# Patient Record
Sex: Male | Born: 1950 | Race: Black or African American | Hispanic: No | Smoking: Former smoker
Health system: Southern US, Community
[De-identification: ages and names within clinical notes are randomized; demographics above are authoritative.]

## PROBLEM LIST (undated history)

## (undated) DIAGNOSIS — E785 Hyperlipidemia, unspecified: Secondary | ICD-10-CM

## (undated) DIAGNOSIS — G4730 Sleep apnea, unspecified: Secondary | ICD-10-CM

## (undated) DIAGNOSIS — I10 Essential (primary) hypertension: Secondary | ICD-10-CM

## (undated) HISTORY — DX: Sleep apnea, unspecified: G47.30

## (undated) HISTORY — DX: Essential (primary) hypertension: I10

## (undated) HISTORY — DX: Morbid (severe) obesity due to excess calories: E66.01

## (undated) HISTORY — DX: Hyperlipidemia, unspecified: E78.5

---

## 2005-04-02 ENCOUNTER — Encounter: Admission: RE | Admit: 2005-04-02 | Discharge: 2005-04-02 | Payer: Self-pay | Admitting: Family Medicine

## 2005-12-27 IMAGING — CR DG CHEST 2V
2 series · 2 of 2 positions shown · non-contrast
Comparison: none

CLINICAL DATA: Bronchitis.  Former smoker.  Cough.  Congestion. 
 CHEST ? TWO VIEWS:
 Two views of the chest show the lungs to be clear.  A poorly defined nodular opacity is seen in the periphery of the right mid lung measuring 7 mm. This probably represents a faintly calcified granuloma, but comparison with prior chest x-ray or follow-up chest x-ray is recommended.  The heart is mildly enlarged.  No acute bony abnormality is seen.

[w chest pa]
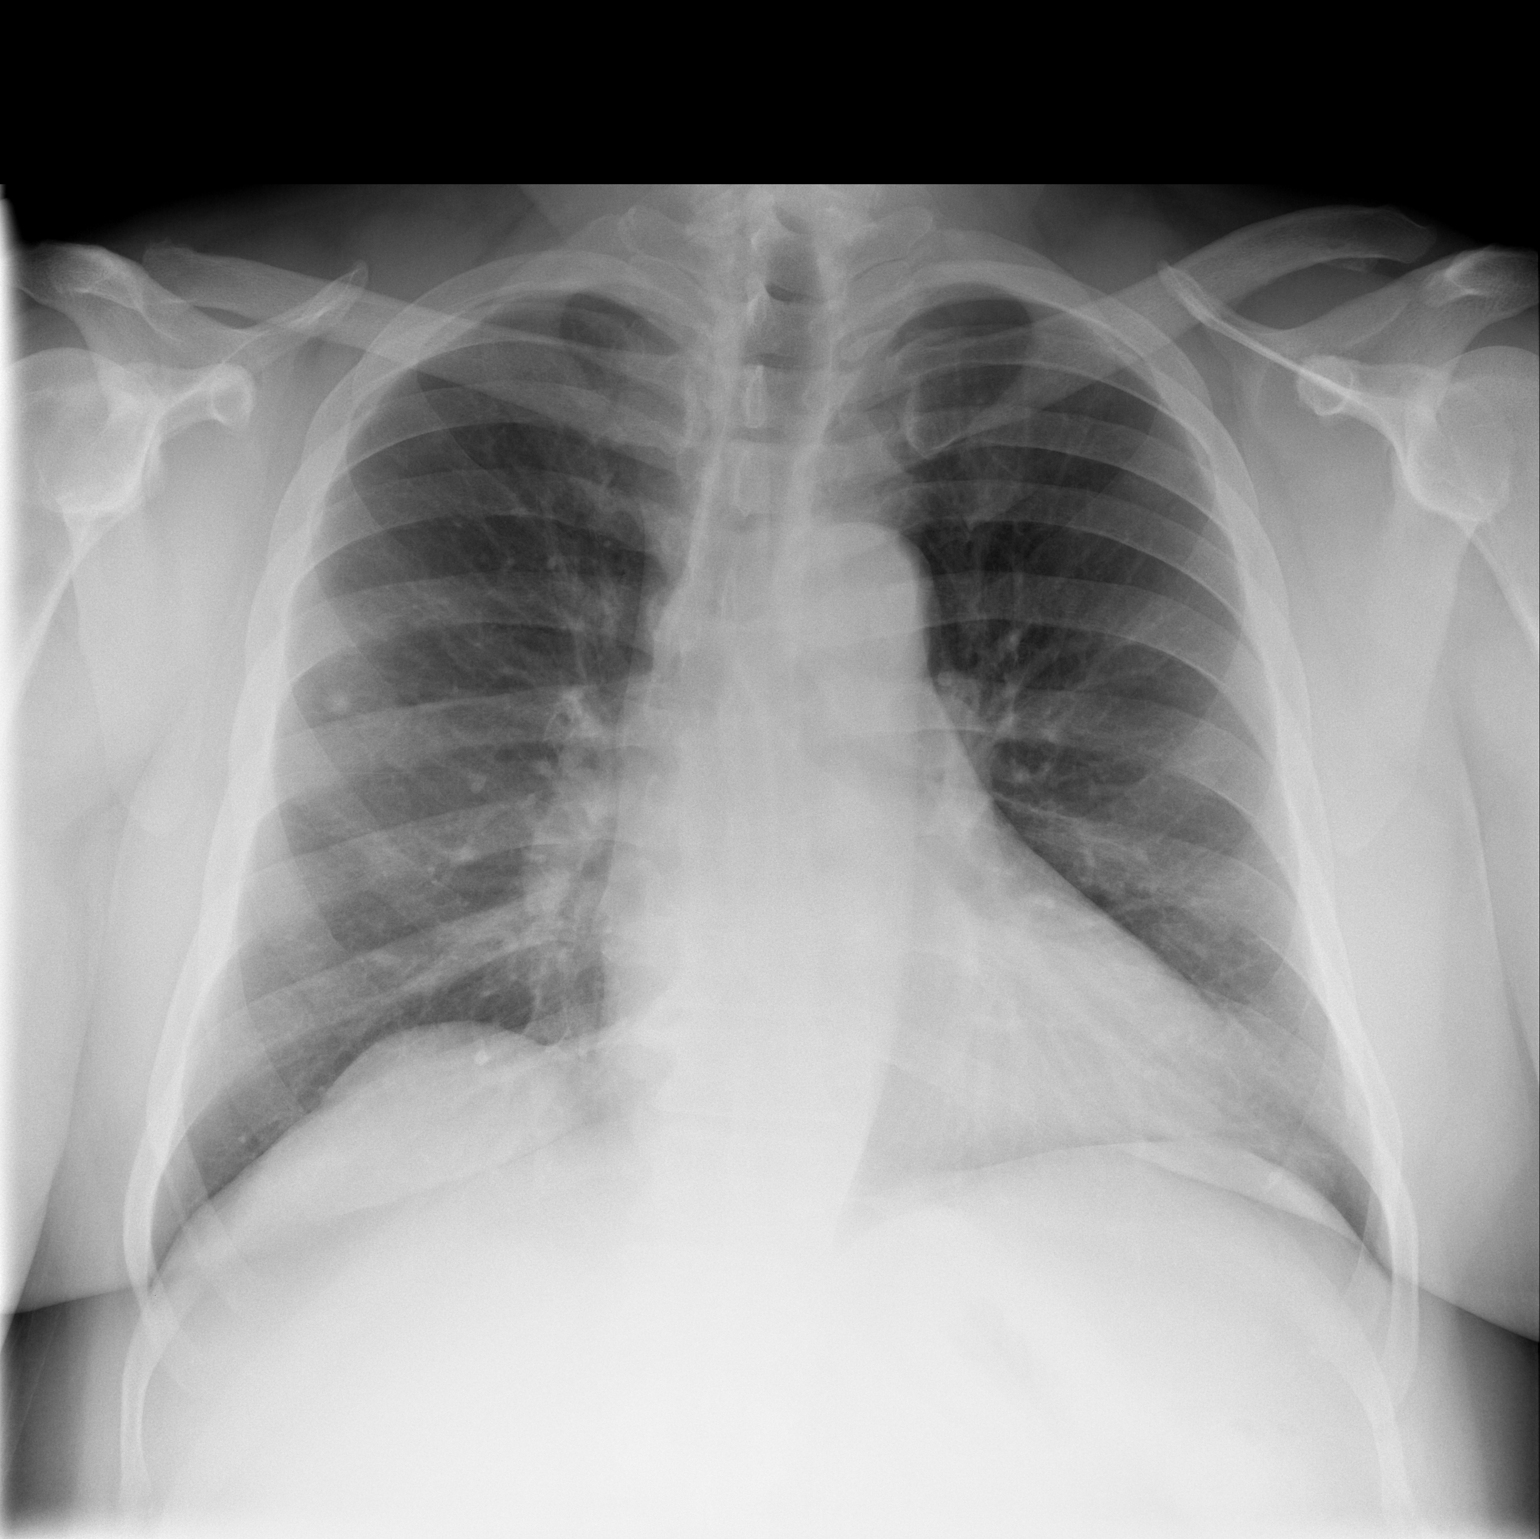

[w chest lat]
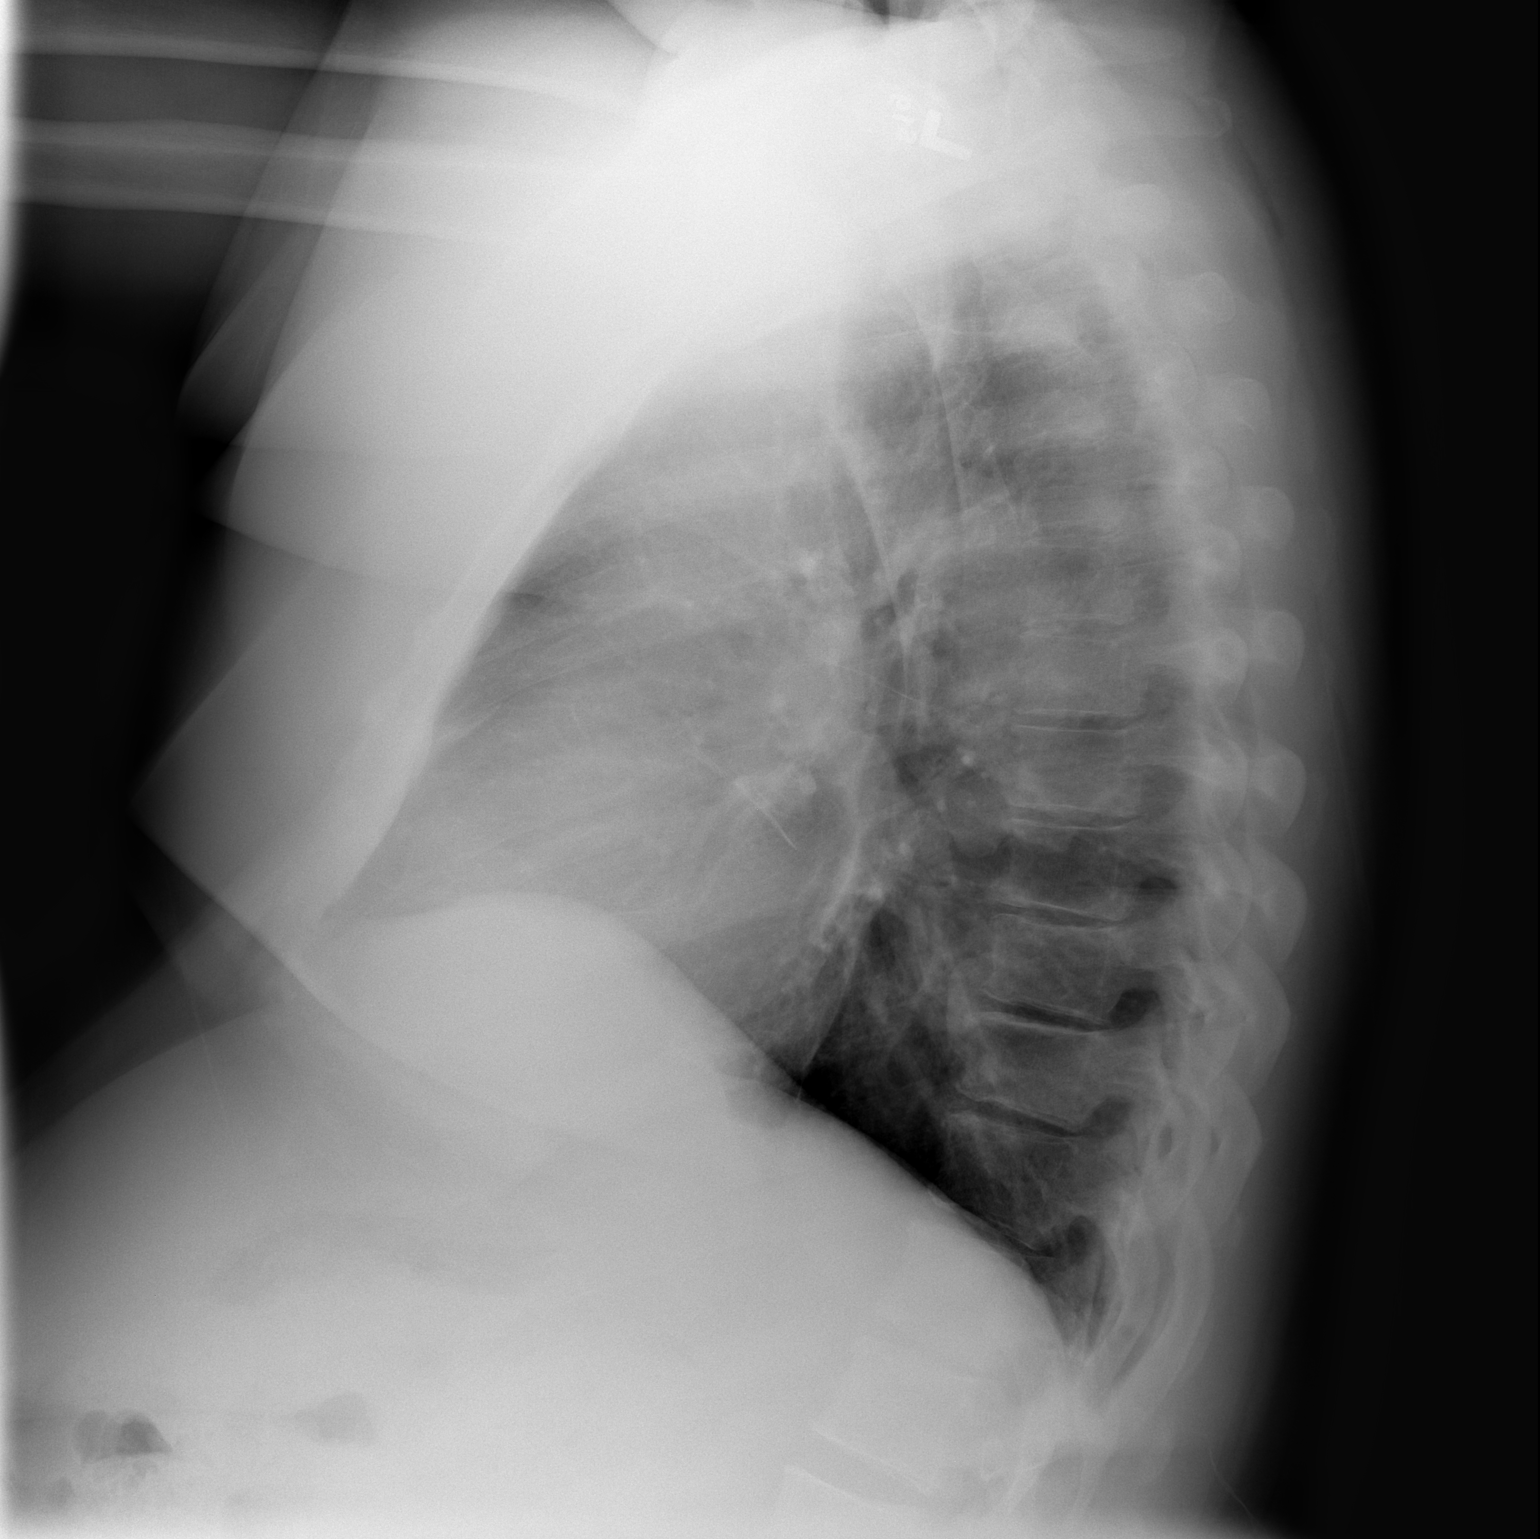

[2 of 2 positions shown; findings below may reference images not displayed]

IMPRESSION: No active lung disease.   Question partially calcified granuloma in the periphery of the right mid lung.  Compare with prior or follow-up chest x-ray.

## 2014-02-07 ENCOUNTER — Encounter (INDEPENDENT_AMBULATORY_CARE_PROVIDER_SITE_OTHER): Payer: Self-pay

## 2014-02-07 ENCOUNTER — Ambulatory Visit (INDEPENDENT_AMBULATORY_CARE_PROVIDER_SITE_OTHER): Payer: No Typology Code available for payment source | Admitting: Pulmonary Disease

## 2014-02-07 ENCOUNTER — Encounter: Payer: Self-pay | Admitting: Pulmonary Disease

## 2014-02-07 VITALS — BP 128/82 | HR 57 | Temp 97.5°F | Ht 70.0 in | Wt 333.4 lb

## 2014-02-07 DIAGNOSIS — G4733 Obstructive sleep apnea (adult) (pediatric): Secondary | ICD-10-CM | POA: Insufficient documentation

## 2014-02-07 NOTE — Assessment & Plan Note (Addendum)
He reports snoring, sleep disruption, apnea, and daytime sleepiness.  I am concerned he has sleep apnea.  We discussed how sleep apnea can affect various health problems including risks for hypertension, cardiovascular disease, and diabetes.  We also discussed how sleep disruption can increase risks for accident, such as while driving.  Weight loss as a means of improving sleep apnea was also reviewed.  Additional treatment options discussed were CPAP therapy, oral appliance, and surgical intervention.  To further assess will arrange for in lab sleep study.

## 2014-02-07 NOTE — Patient Instructions (Signed)
Will arrange for sleep study Will call to arrange for follow up after sleep study reviewed 

## 2014-02-07 NOTE — Progress Notes (Deleted)
   Subjective:    Patient ID: Victor Garrison, male    DOB: 03/05/1951, 63 y.o.   MRN: [id number]  HPI    Review of Systems  Constitutional: Negative for fever and unexpected weight change.  HENT: Positive for postnasal drip. Negative for congestion, dental problem, ear pain, nosebleeds, rhinorrhea, sinus pressure, sneezing, sore throat and trouble swallowing.   Eyes: Negative for redness and itching.  Respiratory: Positive for shortness of breath. Negative for cough, chest tightness and wheezing.   Cardiovascular: Positive for palpitations and leg swelling.  Gastrointestinal: Negative for nausea and vomiting.  Genitourinary: Negative for dysuria.  Musculoskeletal: Positive for joint swelling.  Skin: Negative for rash.  Neurological: Negative for headaches.  Hematological: Does not bruise/bleed easily.  Psychiatric/Behavioral: Negative for dysphoric mood. The patient is not nervous/anxious.        Objective:   Physical Exam        Assessment & Plan:

## 2014-02-07 NOTE — Progress Notes (Signed)
Chief Complaint  Patient presents with  . Sleep Consult    Referred by Dr. Cloyd Stagers for sleep apnea which may be causing fluids retention, per pt. EPWORTH=12    History of Present Illness: Victor Garrison is a 63 y.o. male for evaluation of sleep problems.  He has difficulty with leg swelling.  His PCP was concerned this could be related to sleep apnea.  He tried doing a home sleep study about 1 year ago, but this was an incomplete study.  He does snore and his mouth gets dry while asleep.  He gets sleep during the day, and can sometimes fall asleep when he is sitting quiet.  He goes to sleep at 11 pm.  He falls asleep quickly.  He wakes up 2 to 3 times to use the bathroom.  He gets out of bed at 545 am.  He feels tired in the morning.  He denies morning headache.  He does not use anything to help him fall sleep or stay awake.  He denies sleep walking, sleep talking, bruxism, or nightmares.  There is no history of restless legs.  He denies sleep hallucinations, sleep paralysis, or cataplexy.  The Epworth score is 12 out of 24.  Victor Garrison  has a past medical history of Hyperlipidemia and Sleep apnea.  Victor Garrison  has no past surgical history on file.  Prior to Admission medications   Medication Sig Start Date End Date Taking? Authorizing Provider  Cholecalciferol (VITAMIN D-3) 5000 UNITS TABS Take 1 capsule by mouth daily.   Yes Historical Provider, MD  furosemide (LASIX) 20 MG tablet Take 20 mg by mouth daily.   Yes Historical Provider, MD  Multiple Vitamins-Minerals (MULTIVITAMIN WITH MINERALS) tablet Take 1 tablet by mouth daily.   Yes Historical Provider, MD  testosterone cypionate (DEPOTESTOTERONE CYPIONATE) 200 MG/ML injection Inject 200 mg into the muscle every 28 (twenty-eight) days.   Yes Historical Provider, MD    Allergies  Allergen Reactions  . Penicillins     His family history includes COPD in his brother; Hypertension in his mother; Lung cancer in his brother,  father, and mother.  He  reports that he quit smoking about 18 years ago. His smoking use included Cigarettes. He has a 2.52 pack-year smoking history. He has quit using smokeless tobacco. He reports that he drinks alcohol. He reports that he does not use illicit drugs.  Review of Systems  Constitutional: Negative for fever and unexpected weight change.  HENT: Positive for postnasal drip. Negative for congestion, dental problem, ear pain, nosebleeds, rhinorrhea, sinus pressure, sneezing, sore throat and trouble swallowing.   Eyes: Negative for redness and itching.  Respiratory: Positive for shortness of breath. Negative for cough, chest tightness and wheezing.   Cardiovascular: Positive for palpitations and leg swelling.  Gastrointestinal: Negative for nausea and vomiting.  Genitourinary: Negative for dysuria.  Musculoskeletal: Positive for joint swelling.  Skin: Negative for rash.  Neurological: Negative for headaches.  Hematological: Does not bruise/bleed easily.  Psychiatric/Behavioral: Negative for dysphoric mood. The patient is not nervous/anxious.    Physical Exam:  General - No distress ENT - No sinus tenderness, no oral exudate, no LAN, no thyromegaly, TM clear, pupils equal/reactive, poor dentition, MP 3 Cardiac - s1s2 regular, no murmur, pulses symmetric Chest - No wheeze/rales/dullness, good air entry, normal respiratory excursion Back - No focal tenderness Abd - Soft, non-tender, no organomegaly, + bowel sounds Ext - 1+ lower extremity edema Neuro - Normal strength, cranial nerves intact  Skin - chronic venous stasis changes Psych - Normal mood, and behavior  Assessment/plan:  Victor HellingVineet Pilar Garrison, M.D. Pager [phone]

## 2014-03-15 ENCOUNTER — Encounter (HOSPITAL_BASED_OUTPATIENT_CLINIC_OR_DEPARTMENT_OTHER): Payer: No Typology Code available for payment source

## 2014-04-26 ENCOUNTER — Encounter (HOSPITAL_BASED_OUTPATIENT_CLINIC_OR_DEPARTMENT_OTHER): Payer: No Typology Code available for payment source

## 2014-06-07 ENCOUNTER — Ambulatory Visit (HOSPITAL_BASED_OUTPATIENT_CLINIC_OR_DEPARTMENT_OTHER): Payer: Self-pay | Attending: Pulmonary Disease

## 2016-11-27 DIAGNOSIS — E7800 Pure hypercholesterolemia, unspecified: Secondary | ICD-10-CM | POA: Diagnosis not present

## 2016-11-27 DIAGNOSIS — R079 Chest pain, unspecified: Secondary | ICD-10-CM | POA: Diagnosis not present

## 2016-11-27 DIAGNOSIS — R05 Cough: Secondary | ICD-10-CM | POA: Diagnosis not present

## 2016-11-27 DIAGNOSIS — J209 Acute bronchitis, unspecified: Secondary | ICD-10-CM | POA: Diagnosis not present

## 2016-11-27 DIAGNOSIS — M791 Myalgia: Secondary | ICD-10-CM | POA: Diagnosis not present

## 2016-11-27 DIAGNOSIS — Z87891 Personal history of nicotine dependence: Secondary | ICD-10-CM | POA: Diagnosis not present

## 2016-11-27 DIAGNOSIS — J440 Chronic obstructive pulmonary disease with acute lower respiratory infection: Secondary | ICD-10-CM | POA: Diagnosis not present

## 2016-11-27 DIAGNOSIS — R0789 Other chest pain: Secondary | ICD-10-CM | POA: Diagnosis not present

## 2016-12-09 DIAGNOSIS — R05 Cough: Secondary | ICD-10-CM | POA: Diagnosis not present

## 2016-12-09 DIAGNOSIS — R7303 Prediabetes: Secondary | ICD-10-CM | POA: Diagnosis not present

## 2016-12-09 DIAGNOSIS — E559 Vitamin D deficiency, unspecified: Secondary | ICD-10-CM | POA: Diagnosis not present

## 2016-12-09 DIAGNOSIS — J302 Other seasonal allergic rhinitis: Secondary | ICD-10-CM | POA: Diagnosis not present

## 2016-12-09 DIAGNOSIS — I119 Hypertensive heart disease without heart failure: Secondary | ICD-10-CM | POA: Diagnosis not present

## 2016-12-09 DIAGNOSIS — E785 Hyperlipidemia, unspecified: Secondary | ICD-10-CM | POA: Diagnosis not present

## 2016-12-09 DIAGNOSIS — I10 Essential (primary) hypertension: Secondary | ICD-10-CM | POA: Diagnosis not present

## 2016-12-09 DIAGNOSIS — E7211 Homocystinuria: Secondary | ICD-10-CM | POA: Diagnosis not present

## 2017-03-04 DIAGNOSIS — Z125 Encounter for screening for malignant neoplasm of prostate: Secondary | ICD-10-CM | POA: Diagnosis not present

## 2017-03-04 DIAGNOSIS — E559 Vitamin D deficiency, unspecified: Secondary | ICD-10-CM | POA: Diagnosis not present

## 2017-03-04 DIAGNOSIS — E785 Hyperlipidemia, unspecified: Secondary | ICD-10-CM | POA: Diagnosis not present

## 2017-03-04 DIAGNOSIS — J40 Bronchitis, not specified as acute or chronic: Secondary | ICD-10-CM | POA: Diagnosis not present

## 2017-03-04 DIAGNOSIS — E7211 Homocystinuria: Secondary | ICD-10-CM | POA: Diagnosis not present

## 2017-03-04 DIAGNOSIS — I10 Essential (primary) hypertension: Secondary | ICD-10-CM | POA: Diagnosis not present

## 2017-03-04 DIAGNOSIS — J302 Other seasonal allergic rhinitis: Secondary | ICD-10-CM | POA: Diagnosis not present

## 2017-03-04 DIAGNOSIS — I119 Hypertensive heart disease without heart failure: Secondary | ICD-10-CM | POA: Diagnosis not present

## 2017-03-04 DIAGNOSIS — Z0000 Encounter for general adult medical examination without abnormal findings: Secondary | ICD-10-CM | POA: Diagnosis not present

## 2017-03-04 DIAGNOSIS — R7303 Prediabetes: Secondary | ICD-10-CM | POA: Diagnosis not present

## 2018-03-11 DIAGNOSIS — Z125 Encounter for screening for malignant neoplasm of prostate: Secondary | ICD-10-CM | POA: Diagnosis not present

## 2018-03-11 DIAGNOSIS — E7211 Homocystinuria: Secondary | ICD-10-CM | POA: Diagnosis not present

## 2018-03-11 DIAGNOSIS — R7303 Prediabetes: Secondary | ICD-10-CM | POA: Diagnosis not present

## 2018-03-11 DIAGNOSIS — E559 Vitamin D deficiency, unspecified: Secondary | ICD-10-CM | POA: Diagnosis not present

## 2018-03-11 DIAGNOSIS — Z0000 Encounter for general adult medical examination without abnormal findings: Secondary | ICD-10-CM | POA: Diagnosis not present

## 2018-03-11 DIAGNOSIS — I10 Essential (primary) hypertension: Secondary | ICD-10-CM | POA: Diagnosis not present

## 2018-03-11 DIAGNOSIS — J329 Chronic sinusitis, unspecified: Secondary | ICD-10-CM | POA: Diagnosis not present

## 2018-03-11 DIAGNOSIS — E785 Hyperlipidemia, unspecified: Secondary | ICD-10-CM | POA: Diagnosis not present

## 2018-03-11 DIAGNOSIS — J302 Other seasonal allergic rhinitis: Secondary | ICD-10-CM | POA: Diagnosis not present

## 2018-03-11 DIAGNOSIS — I119 Hypertensive heart disease without heart failure: Secondary | ICD-10-CM | POA: Diagnosis not present

## 2019-03-15 DIAGNOSIS — Z1389 Encounter for screening for other disorder: Secondary | ICD-10-CM | POA: Diagnosis not present

## 2019-03-15 DIAGNOSIS — I119 Hypertensive heart disease without heart failure: Secondary | ICD-10-CM | POA: Diagnosis not present

## 2019-03-15 DIAGNOSIS — E559 Vitamin D deficiency, unspecified: Secondary | ICD-10-CM | POA: Diagnosis not present

## 2019-03-15 DIAGNOSIS — E7211 Homocystinuria: Secondary | ICD-10-CM | POA: Diagnosis not present

## 2019-03-15 DIAGNOSIS — I10 Essential (primary) hypertension: Secondary | ICD-10-CM | POA: Diagnosis not present

## 2019-03-15 DIAGNOSIS — Z125 Encounter for screening for malignant neoplasm of prostate: Secondary | ICD-10-CM | POA: Diagnosis not present

## 2019-03-15 DIAGNOSIS — Z0001 Encounter for general adult medical examination with abnormal findings: Secondary | ICD-10-CM | POA: Diagnosis not present

## 2019-03-15 DIAGNOSIS — R7303 Prediabetes: Secondary | ICD-10-CM | POA: Diagnosis not present

## 2019-03-15 DIAGNOSIS — E782 Mixed hyperlipidemia: Secondary | ICD-10-CM | POA: Diagnosis not present

## 2019-03-15 DIAGNOSIS — J302 Other seasonal allergic rhinitis: Secondary | ICD-10-CM | POA: Diagnosis not present

## 2019-03-21 DIAGNOSIS — R0683 Snoring: Secondary | ICD-10-CM | POA: Diagnosis not present

## 2019-03-21 DIAGNOSIS — G4719 Other hypersomnia: Secondary | ICD-10-CM | POA: Diagnosis not present

## 2019-03-21 DIAGNOSIS — Z6841 Body Mass Index (BMI) 40.0 and over, adult: Secondary | ICD-10-CM | POA: Diagnosis not present

## 2019-03-27 DIAGNOSIS — I10 Essential (primary) hypertension: Secondary | ICD-10-CM | POA: Diagnosis not present

## 2019-03-27 DIAGNOSIS — E782 Mixed hyperlipidemia: Secondary | ICD-10-CM | POA: Diagnosis not present

## 2019-03-27 DIAGNOSIS — E7211 Homocystinuria: Secondary | ICD-10-CM | POA: Diagnosis not present

## 2019-03-27 DIAGNOSIS — Z86010 Personal history of colonic polyps: Secondary | ICD-10-CM | POA: Diagnosis not present

## 2019-03-27 DIAGNOSIS — J302 Other seasonal allergic rhinitis: Secondary | ICD-10-CM | POA: Diagnosis not present

## 2019-03-27 DIAGNOSIS — E559 Vitamin D deficiency, unspecified: Secondary | ICD-10-CM | POA: Diagnosis not present

## 2019-03-27 DIAGNOSIS — D509 Iron deficiency anemia, unspecified: Secondary | ICD-10-CM | POA: Diagnosis not present

## 2019-03-27 DIAGNOSIS — I119 Hypertensive heart disease without heart failure: Secondary | ICD-10-CM | POA: Diagnosis not present

## 2019-03-27 DIAGNOSIS — R7303 Prediabetes: Secondary | ICD-10-CM | POA: Diagnosis not present

## 2019-04-02 DIAGNOSIS — R0683 Snoring: Secondary | ICD-10-CM | POA: Diagnosis not present

## 2019-04-02 DIAGNOSIS — G4719 Other hypersomnia: Secondary | ICD-10-CM | POA: Diagnosis not present

## 2019-04-07 DIAGNOSIS — G4733 Obstructive sleep apnea (adult) (pediatric): Secondary | ICD-10-CM | POA: Diagnosis not present

## 2019-04-07 DIAGNOSIS — R0683 Snoring: Secondary | ICD-10-CM | POA: Diagnosis not present

## 2019-04-07 DIAGNOSIS — G4719 Other hypersomnia: Secondary | ICD-10-CM | POA: Diagnosis not present

## 2019-04-07 DIAGNOSIS — Z6841 Body Mass Index (BMI) 40.0 and over, adult: Secondary | ICD-10-CM | POA: Diagnosis not present

## 2019-05-26 DIAGNOSIS — Z03818 Encounter for observation for suspected exposure to other biological agents ruled out: Secondary | ICD-10-CM | POA: Diagnosis not present

## 2019-06-21 ENCOUNTER — Other Ambulatory Visit: Payer: Self-pay

## 2019-06-21 DIAGNOSIS — Z20822 Contact with and (suspected) exposure to covid-19: Secondary | ICD-10-CM

## 2019-06-22 LAB — NOVEL CORONAVIRUS, NAA: SARS-CoV-2, NAA: NOT DETECTED

## 2019-07-03 DIAGNOSIS — E7211 Homocystinuria: Secondary | ICD-10-CM | POA: Diagnosis not present

## 2019-07-03 DIAGNOSIS — I10 Essential (primary) hypertension: Secondary | ICD-10-CM | POA: Diagnosis not present

## 2019-07-03 DIAGNOSIS — Z86010 Personal history of colonic polyps: Secondary | ICD-10-CM | POA: Diagnosis not present

## 2019-07-03 DIAGNOSIS — E782 Mixed hyperlipidemia: Secondary | ICD-10-CM | POA: Diagnosis not present

## 2019-07-03 DIAGNOSIS — R7303 Prediabetes: Secondary | ICD-10-CM | POA: Diagnosis not present

## 2019-07-03 DIAGNOSIS — E559 Vitamin D deficiency, unspecified: Secondary | ICD-10-CM | POA: Diagnosis not present

## 2019-07-03 DIAGNOSIS — I119 Hypertensive heart disease without heart failure: Secondary | ICD-10-CM | POA: Diagnosis not present

## 2019-07-03 DIAGNOSIS — D509 Iron deficiency anemia, unspecified: Secondary | ICD-10-CM | POA: Diagnosis not present

## 2019-07-03 DIAGNOSIS — J302 Other seasonal allergic rhinitis: Secondary | ICD-10-CM | POA: Diagnosis not present

## 2019-07-03 DIAGNOSIS — G4733 Obstructive sleep apnea (adult) (pediatric): Secondary | ICD-10-CM | POA: Diagnosis not present

## 2019-07-27 ENCOUNTER — Other Ambulatory Visit: Payer: Self-pay

## 2019-07-27 DIAGNOSIS — Z20822 Contact with and (suspected) exposure to covid-19: Secondary | ICD-10-CM

## 2019-07-27 DIAGNOSIS — R6889 Other general symptoms and signs: Secondary | ICD-10-CM | POA: Diagnosis not present

## 2019-07-28 LAB — NOVEL CORONAVIRUS, NAA: SARS-CoV-2, NAA: NOT DETECTED

## 2019-08-14 ENCOUNTER — Other Ambulatory Visit: Payer: Self-pay

## 2019-08-14 DIAGNOSIS — Z20822 Contact with and (suspected) exposure to covid-19: Secondary | ICD-10-CM

## 2019-08-15 LAB — NOVEL CORONAVIRUS, NAA: SARS-CoV-2, NAA: NOT DETECTED

## 2019-09-06 ENCOUNTER — Other Ambulatory Visit: Payer: Self-pay

## 2019-09-06 DIAGNOSIS — Z20828 Contact with and (suspected) exposure to other viral communicable diseases: Secondary | ICD-10-CM | POA: Diagnosis not present

## 2019-09-06 DIAGNOSIS — Z20822 Contact with and (suspected) exposure to covid-19: Secondary | ICD-10-CM

## 2019-09-08 LAB — NOVEL CORONAVIRUS, NAA: SARS-CoV-2, NAA: NOT DETECTED

## 2019-10-06 ENCOUNTER — Other Ambulatory Visit: Payer: Self-pay

## 2019-10-06 DIAGNOSIS — Z20822 Contact with and (suspected) exposure to covid-19: Secondary | ICD-10-CM

## 2019-10-06 DIAGNOSIS — Z20828 Contact with and (suspected) exposure to other viral communicable diseases: Secondary | ICD-10-CM | POA: Diagnosis not present

## 2019-10-09 LAB — NOVEL CORONAVIRUS, NAA: SARS-CoV-2, NAA: NOT DETECTED

## 2020-09-10 DIAGNOSIS — Z23 Encounter for immunization: Secondary | ICD-10-CM | POA: Diagnosis not present

## 2021-08-27 DIAGNOSIS — R7303 Prediabetes: Secondary | ICD-10-CM | POA: Diagnosis not present

## 2021-08-27 DIAGNOSIS — G4733 Obstructive sleep apnea (adult) (pediatric): Secondary | ICD-10-CM | POA: Diagnosis not present

## 2021-08-27 DIAGNOSIS — Z86010 Personal history of colonic polyps: Secondary | ICD-10-CM | POA: Diagnosis not present

## 2021-08-27 DIAGNOSIS — E782 Mixed hyperlipidemia: Secondary | ICD-10-CM | POA: Diagnosis not present

## 2021-08-27 DIAGNOSIS — I10 Essential (primary) hypertension: Secondary | ICD-10-CM | POA: Diagnosis not present

## 2021-08-27 DIAGNOSIS — Z125 Encounter for screening for malignant neoplasm of prostate: Secondary | ICD-10-CM | POA: Diagnosis not present

## 2021-08-27 DIAGNOSIS — Z0001 Encounter for general adult medical examination with abnormal findings: Secondary | ICD-10-CM | POA: Diagnosis not present

## 2021-08-27 DIAGNOSIS — D509 Iron deficiency anemia, unspecified: Secondary | ICD-10-CM | POA: Diagnosis not present

## 2021-08-27 DIAGNOSIS — I119 Hypertensive heart disease without heart failure: Secondary | ICD-10-CM | POA: Diagnosis not present

## 2021-08-27 DIAGNOSIS — E559 Vitamin D deficiency, unspecified: Secondary | ICD-10-CM | POA: Diagnosis not present

## 2021-10-02 DIAGNOSIS — Z1152 Encounter for screening for COVID-19: Secondary | ICD-10-CM | POA: Diagnosis not present

## 2021-10-21 DIAGNOSIS — I10 Essential (primary) hypertension: Secondary | ICD-10-CM | POA: Diagnosis not present

## 2021-10-21 DIAGNOSIS — E782 Mixed hyperlipidemia: Secondary | ICD-10-CM | POA: Diagnosis not present

## 2021-10-21 DIAGNOSIS — G4733 Obstructive sleep apnea (adult) (pediatric): Secondary | ICD-10-CM | POA: Diagnosis not present

## 2021-10-21 DIAGNOSIS — Z86010 Personal history of colonic polyps: Secondary | ICD-10-CM | POA: Diagnosis not present

## 2021-10-21 DIAGNOSIS — D509 Iron deficiency anemia, unspecified: Secondary | ICD-10-CM | POA: Diagnosis not present

## 2021-10-21 DIAGNOSIS — E559 Vitamin D deficiency, unspecified: Secondary | ICD-10-CM | POA: Diagnosis not present

## 2021-10-21 DIAGNOSIS — R7303 Prediabetes: Secondary | ICD-10-CM | POA: Diagnosis not present

## 2021-10-21 DIAGNOSIS — I119 Hypertensive heart disease without heart failure: Secondary | ICD-10-CM | POA: Diagnosis not present

## 2021-12-24 DIAGNOSIS — Z86010 Personal history of colonic polyps: Secondary | ICD-10-CM | POA: Diagnosis not present

## 2021-12-24 DIAGNOSIS — D509 Iron deficiency anemia, unspecified: Secondary | ICD-10-CM | POA: Diagnosis not present

## 2021-12-24 DIAGNOSIS — E782 Mixed hyperlipidemia: Secondary | ICD-10-CM | POA: Diagnosis not present

## 2021-12-24 DIAGNOSIS — I119 Hypertensive heart disease without heart failure: Secondary | ICD-10-CM | POA: Diagnosis not present

## 2021-12-24 DIAGNOSIS — I10 Essential (primary) hypertension: Secondary | ICD-10-CM | POA: Diagnosis not present

## 2021-12-24 DIAGNOSIS — Z111 Encounter for screening for respiratory tuberculosis: Secondary | ICD-10-CM | POA: Diagnosis not present

## 2021-12-24 DIAGNOSIS — G4733 Obstructive sleep apnea (adult) (pediatric): Secondary | ICD-10-CM | POA: Diagnosis not present

## 2021-12-24 DIAGNOSIS — R7303 Prediabetes: Secondary | ICD-10-CM | POA: Diagnosis not present

## 2021-12-24 DIAGNOSIS — E559 Vitamin D deficiency, unspecified: Secondary | ICD-10-CM | POA: Diagnosis not present

## 2022-09-10 DIAGNOSIS — E559 Vitamin D deficiency, unspecified: Secondary | ICD-10-CM | POA: Diagnosis not present

## 2022-09-10 DIAGNOSIS — R7303 Prediabetes: Secondary | ICD-10-CM | POA: Diagnosis not present

## 2022-09-10 DIAGNOSIS — Z86010 Personal history of colonic polyps: Secondary | ICD-10-CM | POA: Diagnosis not present

## 2022-09-10 DIAGNOSIS — I10 Essential (primary) hypertension: Secondary | ICD-10-CM | POA: Diagnosis not present

## 2022-09-10 DIAGNOSIS — G4733 Obstructive sleep apnea (adult) (pediatric): Secondary | ICD-10-CM | POA: Diagnosis not present

## 2022-09-10 DIAGNOSIS — D509 Iron deficiency anemia, unspecified: Secondary | ICD-10-CM | POA: Diagnosis not present

## 2022-09-10 DIAGNOSIS — Z0001 Encounter for general adult medical examination with abnormal findings: Secondary | ICD-10-CM | POA: Diagnosis not present

## 2022-09-10 DIAGNOSIS — I119 Hypertensive heart disease without heart failure: Secondary | ICD-10-CM | POA: Diagnosis not present

## 2022-09-10 DIAGNOSIS — E782 Mixed hyperlipidemia: Secondary | ICD-10-CM | POA: Diagnosis not present

## 2022-09-18 ENCOUNTER — Ambulatory Visit: Payer: Medicare Other | Admitting: Podiatry

## 2022-09-30 ENCOUNTER — Encounter: Payer: Self-pay | Admitting: Podiatry

## 2022-09-30 ENCOUNTER — Ambulatory Visit (INDEPENDENT_AMBULATORY_CARE_PROVIDER_SITE_OTHER): Payer: Medicare Other | Admitting: Podiatry

## 2022-09-30 DIAGNOSIS — M79675 Pain in left toe(s): Secondary | ICD-10-CM | POA: Diagnosis not present

## 2022-09-30 DIAGNOSIS — M79674 Pain in right toe(s): Secondary | ICD-10-CM | POA: Diagnosis not present

## 2022-09-30 DIAGNOSIS — B351 Tinea unguium: Secondary | ICD-10-CM | POA: Diagnosis not present

## 2022-09-30 NOTE — Progress Notes (Signed)
  Subjective:  Patient ID: Victor Garrison, male    DOB: 1951-09-14,  MRN: [id number]  Chief Complaint  Patient presents with   Nail Problem    Nail trim    71 y.o. male returns for the above complaint.  Patient present thickened elongated dystrophic toenails x10 mild pain on palpation hurts with ambulation worse with pressure he would like for me to debride down is not able to do it himself.  Objective:  There were no vitals filed for this visit. Podiatric Exam: Vascular: dorsalis pedis and posterior tibial pulses are palpable bilateral. Capillary return is immediate. Temperature gradient is WNL. Skin turgor WNL  Sensorium: Normal Semmes Weinstein monofilament test. Normal tactile sensation bilaterally. Nail Exam: Pt has thick disfigured discolored nails with subungual debris noted bilateral entire nail hallux through fifth toenails.  Pain on palpation to the nails. Ulcer Exam: There is no evidence of ulcer or pre-ulcerative changes or infection. Orthopedic Exam: Muscle tone and strength are WNL. No limitations in general ROM. No crepitus or effusions noted.  Skin: No Porokeratosis. No infection or ulcers    Assessment & Plan:   1. Pain due to onychomycosis of toenails of both feet     Patient was evaluated and treated and all questions answered.  Onychomycosis with pain  -Nails palliatively debrided as below. -Educated on self-care  Procedure: Nail Debridement Rationale: pain  Type of Debridement: manual, sharp debridement. Instrumentation: Nail nipper, rotary burr. Number of Nails: 10  Procedures and Treatment: Consent by patient was obtained for treatment procedures. The patient understood the discussion of treatment and procedures well. All questions were answered thoroughly reviewed. Debridement of mycotic and hypertrophic toenails, 1 through 5 bilateral and clearing of subungual debris. No ulceration, no infection noted.  Return Visit-Office Procedure: Patient  instructed to return to the office for a follow up visit 3 months for continued evaluation and treatment.  Nicholes Rough, DPM    Return in about 3 months (around 12/31/2022) for Routine Foot Care.

## 2023-01-08 ENCOUNTER — Encounter: Payer: Self-pay | Admitting: Podiatry

## 2023-01-08 ENCOUNTER — Ambulatory Visit (INDEPENDENT_AMBULATORY_CARE_PROVIDER_SITE_OTHER): Payer: Medicare Other | Admitting: Podiatry

## 2023-01-08 DIAGNOSIS — M79674 Pain in right toe(s): Secondary | ICD-10-CM | POA: Diagnosis not present

## 2023-01-08 DIAGNOSIS — M79675 Pain in left toe(s): Secondary | ICD-10-CM

## 2023-01-08 DIAGNOSIS — B351 Tinea unguium: Secondary | ICD-10-CM | POA: Diagnosis not present

## 2023-01-08 NOTE — Progress Notes (Signed)
This patient presents to the office with chief complaint of long thick painful nails.  Patient says the nails are painful walking and wearing shoes.  This patient is unable to self treat.  This patient is unable to trim her nails since she is unable to reach her nails.  She presents to the office for preventative foot care services.  General Appearance  Alert, conversant and in no acute stress.  Vascular  Dorsalis pedis and posterior tibial  pulses are weakly  palpable due to swelling. bilaterally.  Capillary return is within normal limits  bilaterally. Temperature is within normal limits  bilaterally.  Neurologic  Senn-Weinstein monofilament wire test within normal limits  bilaterally. Muscle power within normal limits bilaterally.  Nails Thick disfigured discolored nails with subungual debris  from hallux to fifth toes bilaterally. No evidence of bacterial infection or drainage bilaterally.  Orthopedic  No limitations of motion  feet .  No crepitus or effusions noted.  No bony pathology or digital deformities noted.  Skin  normotropic skin with no porokeratosis noted bilaterally.  No signs of infections or ulcers noted.     Onychomycosis  Nails  B/L.  Pain in right toes  Pain in left toes  Debridement of nails both feet followed trimming the nails with dremel tool.    RTC 3 months.   Gardiner Barefoot DPM

## 2023-03-18 DIAGNOSIS — Z0000 Encounter for general adult medical examination without abnormal findings: Secondary | ICD-10-CM | POA: Diagnosis not present

## 2023-03-18 DIAGNOSIS — Z125 Encounter for screening for malignant neoplasm of prostate: Secondary | ICD-10-CM | POA: Diagnosis not present

## 2023-03-18 DIAGNOSIS — R7303 Prediabetes: Secondary | ICD-10-CM | POA: Diagnosis not present

## 2023-03-18 DIAGNOSIS — I10 Essential (primary) hypertension: Secondary | ICD-10-CM | POA: Diagnosis not present

## 2023-03-18 DIAGNOSIS — G4733 Obstructive sleep apnea (adult) (pediatric): Secondary | ICD-10-CM | POA: Diagnosis not present

## 2023-03-18 DIAGNOSIS — D508 Other iron deficiency anemias: Secondary | ICD-10-CM | POA: Diagnosis not present

## 2023-03-18 DIAGNOSIS — E782 Mixed hyperlipidemia: Secondary | ICD-10-CM | POA: Diagnosis not present

## 2023-03-18 DIAGNOSIS — E559 Vitamin D deficiency, unspecified: Secondary | ICD-10-CM | POA: Diagnosis not present

## 2023-03-18 DIAGNOSIS — I119 Hypertensive heart disease without heart failure: Secondary | ICD-10-CM | POA: Diagnosis not present

## 2023-04-01 DIAGNOSIS — D508 Other iron deficiency anemias: Secondary | ICD-10-CM | POA: Diagnosis not present

## 2023-04-01 DIAGNOSIS — E782 Mixed hyperlipidemia: Secondary | ICD-10-CM | POA: Diagnosis not present

## 2023-04-01 DIAGNOSIS — B359 Dermatophytosis, unspecified: Secondary | ICD-10-CM | POA: Diagnosis not present

## 2023-04-01 DIAGNOSIS — R7303 Prediabetes: Secondary | ICD-10-CM | POA: Diagnosis not present

## 2023-04-01 DIAGNOSIS — E559 Vitamin D deficiency, unspecified: Secondary | ICD-10-CM | POA: Diagnosis not present

## 2023-04-01 DIAGNOSIS — I119 Hypertensive heart disease without heart failure: Secondary | ICD-10-CM | POA: Diagnosis not present

## 2023-04-01 DIAGNOSIS — G4733 Obstructive sleep apnea (adult) (pediatric): Secondary | ICD-10-CM | POA: Diagnosis not present

## 2023-04-01 DIAGNOSIS — I10 Essential (primary) hypertension: Secondary | ICD-10-CM | POA: Diagnosis not present

## 2023-04-06 DIAGNOSIS — M79671 Pain in right foot: Secondary | ICD-10-CM | POA: Diagnosis not present

## 2023-04-06 DIAGNOSIS — M25561 Pain in right knee: Secondary | ICD-10-CM | POA: Diagnosis not present

## 2023-04-06 DIAGNOSIS — E782 Mixed hyperlipidemia: Secondary | ICD-10-CM | POA: Diagnosis not present

## 2023-04-06 DIAGNOSIS — I119 Hypertensive heart disease without heart failure: Secondary | ICD-10-CM | POA: Diagnosis not present

## 2023-04-06 DIAGNOSIS — D508 Other iron deficiency anemias: Secondary | ICD-10-CM | POA: Diagnosis not present

## 2023-04-06 DIAGNOSIS — E559 Vitamin D deficiency, unspecified: Secondary | ICD-10-CM | POA: Diagnosis not present

## 2023-04-06 DIAGNOSIS — R7303 Prediabetes: Secondary | ICD-10-CM | POA: Diagnosis not present

## 2023-04-06 DIAGNOSIS — M25562 Pain in left knee: Secondary | ICD-10-CM | POA: Diagnosis not present

## 2023-04-06 DIAGNOSIS — B359 Dermatophytosis, unspecified: Secondary | ICD-10-CM | POA: Diagnosis not present

## 2023-04-06 DIAGNOSIS — I10 Essential (primary) hypertension: Secondary | ICD-10-CM | POA: Diagnosis not present

## 2023-04-06 DIAGNOSIS — M79672 Pain in left foot: Secondary | ICD-10-CM | POA: Diagnosis not present

## 2023-04-06 DIAGNOSIS — G4733 Obstructive sleep apnea (adult) (pediatric): Secondary | ICD-10-CM | POA: Diagnosis not present

## 2023-04-09 ENCOUNTER — Encounter: Payer: Self-pay | Admitting: Podiatry

## 2023-04-09 ENCOUNTER — Ambulatory Visit (INDEPENDENT_AMBULATORY_CARE_PROVIDER_SITE_OTHER): Payer: Medicare Other | Admitting: Podiatry

## 2023-04-09 DIAGNOSIS — B351 Tinea unguium: Secondary | ICD-10-CM

## 2023-04-09 DIAGNOSIS — M79675 Pain in left toe(s): Secondary | ICD-10-CM | POA: Diagnosis not present

## 2023-04-09 DIAGNOSIS — M79674 Pain in right toe(s): Secondary | ICD-10-CM

## 2023-04-09 NOTE — Progress Notes (Signed)
This patient presents to the office with chief complaint of long thick painful nails.  Patient says the nails are painful walking and wearing shoes.  This patient is unable to self treat.  This patient is unable to trim her nails since she is unable to reach her nails.  She presents to the office for preventative foot care services.  General Appearance  Alert, conversant and in no acute stress.  Vascular  Dorsalis pedis and posterior tibial  pulses are weakly  palpable due to swelling. bilaterally.  Capillary return is within normal limits  bilaterally. Temperature is within normal limits  bilaterally.  Neurologic  Senn-Weinstein monofilament wire test within normal limits  bilaterally. Muscle power within normal limits bilaterally.  Nails Thick disfigured discolored nails with subungual debris  from hallux to fifth toes bilaterally. No evidence of bacterial infection or drainage bilaterally.  Orthopedic  No limitations of motion  feet .  No crepitus or effusions noted.  No bony pathology or digital deformities noted.  Skin  normotropic skin with no porokeratosis noted bilaterally.  No signs of infections or ulcers noted.     Onychomycosis  Nails  B/L.  Pain in right toes  Pain in left toes  Debridement of nails both feet followed trimming the nails with dremel tool.    RTC 3 months.   Nemiah Kissner DPM   

## 2023-04-23 ENCOUNTER — Ambulatory Visit (INDEPENDENT_AMBULATORY_CARE_PROVIDER_SITE_OTHER): Payer: Medicare Other | Admitting: Podiatry

## 2023-04-23 DIAGNOSIS — R200 Anesthesia of skin: Secondary | ICD-10-CM | POA: Diagnosis not present

## 2023-04-23 DIAGNOSIS — R202 Paresthesia of skin: Secondary | ICD-10-CM

## 2023-04-23 DIAGNOSIS — Z8739 Personal history of other diseases of the musculoskeletal system and connective tissue: Secondary | ICD-10-CM | POA: Diagnosis not present

## 2023-04-23 NOTE — Progress Notes (Signed)
  Subjective:  Patient ID: Victor Garrison, male    DOB: Jun 24, 1951,  MRN: [id number]  Chief Complaint  Patient presents with   Numbness    Patient has hard numbess bilateral feet for some years. Aggravating factors varies. Treatment     72 y.o. male presents with the above complaint.  Patient presents with bilateral numbness tingling to both feet that has been on for some years is progressive gotten worse.  He would like to discuss other treatment options.  Rest of the HPI as above.   Review of Systems: Negative except as noted in the HPI. Denies N/V/F/Ch.  Past Medical History:  Diagnosis Date   Hyperlipidemia    Sleep apnea     Current Outpatient Medications:    Cholecalciferol (VITAMIN D-3) 5000 UNITS TABS, Take 1 capsule by mouth daily., Disp: , Rfl:    furosemide (LASIX) 20 MG tablet, Take 20 mg by mouth daily., Disp: , Rfl:    Multiple Vitamins-Minerals (MULTIVITAMIN WITH MINERALS) tablet, Take 1 tablet by mouth daily., Disp: , Rfl:    testosterone cypionate (DEPOTESTOTERONE CYPIONATE) 200 MG/ML injection, Inject 200 mg into the muscle every 28 (twenty-eight) days., Disp: , Rfl:   Social History   Tobacco Use  Smoking Status Former   Packs/day: 0.63   Years: 4.00   Additional pack years: 0.00   Total pack years: 2.52   Types: Cigarettes   Quit date: 11/17/1995   Years since quitting: 27.4  Smokeless Tobacco Former    Allergies  Allergen Reactions   Penicillins    Objective:  There were no vitals filed for this visit. There is no height or weight on file to calculate BMI. Constitutional Well developed. Well nourished.  Vascular Dorsalis pedis pulses palpable bilaterally. Posterior tibial pulses palpable bilaterally. Capillary refill normal to all digits.  No cyanosis or clubbing noted. Pedal hair growth normal.  Neurologic Normal speech. Oriented to person, place, and time. Epicritic sensation to light touch grossly present bilaterally.  Negative tarsal  tunnel syndrome.  Negative common peroneal nerve entrapment  Dermatologic Nails within normal limits Skin within the limits  Orthopedic: Normal joint ROM without pain or crepitus bilaterally. No visible deformities. No bony tenderness.   Radiographs: None Assessment:   1. Numbness and tingling    Plan:  Patient was evaluated and treated and all questions answered.  Bilateral numbness tingling with history of low back pain -All questions or concerns were discussed with the patient in extensive detail -I believe the likely etiology of this is his lower back pain likely compressing the nerve and causing both foot and ankle to be completely numb.  I discussed with patient he states understanding.  He will benefit from back specialist follow-up he states that he will make an appointment All No follow-ups on file.

## 2023-05-12 ENCOUNTER — Encounter: Payer: Self-pay | Admitting: Primary Care

## 2023-05-12 ENCOUNTER — Ambulatory Visit (INDEPENDENT_AMBULATORY_CARE_PROVIDER_SITE_OTHER): Payer: Medicare Other | Admitting: Primary Care

## 2023-05-12 VITALS — BP 142/76 | HR 95 | Temp 98.8°F | Ht 70.0 in | Wt 375.8 lb

## 2023-05-12 DIAGNOSIS — G4733 Obstructive sleep apnea (adult) (pediatric): Secondary | ICD-10-CM | POA: Diagnosis not present

## 2023-05-12 NOTE — Assessment & Plan Note (Signed)
-   Sleep study greater than 10 years, results not available in chart.  Previously had difficulty completing home sleep study.  Recent weight gain.  Epworth 13.  BMI 53.  He will need an in lab split-night sleep study to reassess OSA.  Patient is open to CPAP if needed.  We reviewed risks of untreated sleep apnea including cardiac arrhythmias, pulmonary hypertension, diabetes and stroke.  We also discussed treatment options including weight loss, oral appliance, CPAP therapy or referral to ENT for possible surgical options.  Encourage side sleeping position.  Advised against driving if experiencing excessive daytime sleepiness fatigue.  Follow-up 1 to 2 weeks after sleep study review results or treatment options if needed.

## 2023-05-12 NOTE — Progress Notes (Signed)
@Patient  ID: Victor Garrison, male    DOB: Nov 24, 1950, 72 y.o.   MRN: [id number]  Chief Complaint  Patient presents with   Consult    SOB, not sleeping well, snoring, swelling in feet.    Referring provider: Norm Salt, PA  HPI: 72 year old male, former smoker quit 1997.  Past medical history significant for OSA, HTN, prediabetes, iron deficiency, vit D deficiency, obesity.  05/12/2023 Patient presents today for sleep consult. He reports history of severe sleep apnea. Never started on CPAP. No known cardiac hx. He tells me he has been given a diagnosis of COPD, former smoker quit 20 years. Not on maintenance bronchodilators. He had prior home sleep study but had difficulty completing and follow instructions. Sleep pattern varies. He falls asleep and naps during the day. Typical bedtime 11pm-2am. No issues falling asleep. He wakes up 4-5 times at night to use restroom. He starts his day between 6:30-8am. He works at a group home, he provides residential housing. No concern for narcolepsy, cataplexy and sleep walking.   Sleep questionnaire Symptoms- woken himself up snoring, restless sleep, daytime sleepiness  Prior sleep study- >10 years ago [facility] [place]  Bedtime-11PM-2AM Time to fall asleep- Quickly  Nocturnal awakenings- 4-5 times  Out of bed/start of day- 6:30-8am Weight changes- 30 lbs  Do you operate heavy machinery- no Do you currently wear CPAP- no Do you current wear oxygen- no Epworth- 13   Allergies  Allergen Reactions   Penicillins     Immunization History  Administered Date(s) Administered   Influenza Split 08/16/2013    Past Medical History:  Diagnosis Date   Hyperlipidemia    Sleep apnea     Tobacco History: Social History   Tobacco Use  Smoking Status Former   Packs/day: 0.63   Years: 4.00   Additional pack years: 0.00   Total pack years: 2.52   Types: Cigarettes   Quit date: 11/17/1995   Years since quitting: 27.5  Smokeless Tobacco  Former   Counseling given: Not Answered   Outpatient Medications Prior to Visit  Medication Sig Dispense Refill   Aspirin-Salicylamide-Caffeine (BC HEADACHE POWDER PO) Take by mouth.     Green Tea, Camellia sinensis, (GREEN TEA PO) Take by mouth.     Multiple Vitamins-Minerals (MULTIVITAMIN WITH MINERALS) tablet Take 1 tablet by mouth daily.     Cholecalciferol (VITAMIN D-3) 5000 UNITS TABS Take 1 capsule by mouth daily. (Patient not taking: Reported on 05/12/2023)     furosemide (LASIX) 20 MG tablet Take 20 mg by mouth daily. (Patient not taking: Reported on 05/12/2023)     testosterone cypionate (DEPOTESTOTERONE CYPIONATE) 200 MG/ML injection Inject 200 mg into the muscle every 28 (twenty-eight) days. (Patient not taking: Reported on 05/12/2023)     No facility-administered medications prior to visit.    Review of Systems  Review of Systems  Constitutional: Negative.   Respiratory: Negative.    Cardiovascular: Negative.     Physical Exam  BP (!) 142/76 (BP Location: Left Arm, Patient Position: Sitting, Cuff Size: Large)   Pulse 95   Temp 98.8 F (37.1 C) (Oral)   Ht 5\' 10"  (1.778 m)   Wt (!) 375 lb 12.8 oz (170.5 kg)   SpO2 99%   BMI 53.92 kg/m  Physical Exam Constitutional:      Appearance: Normal appearance. He is obese.  HENT:     Head: Normocephalic and atraumatic.  Cardiovascular:     Rate and Rhythm: Normal rate and regular rhythm.  Pulmonary:     Effort: Pulmonary effort is normal.     Breath sounds: Normal breath sounds. No wheezing, rhonchi or rales.  Musculoskeletal:        General: Normal range of motion.  Skin:    General: Skin is warm and dry.  Neurological:     General: No focal deficit present.     Mental Status: He is alert and oriented to person, place, and time. Mental status is at baseline.  Psychiatric:        Mood and Affect: Mood normal.        Behavior: Behavior normal.        Thought Content: Thought content normal.        Judgment:  Judgment normal.      Lab Results:  CBC No results found for: "WBC", "RBC", "HGB", "HCT", "PLT", "MCV", "MCH", "MCHC", "RDW", "LYMPHSABS", "MONOABS", "EOSABS", "BASOSABS"  BMET No results found for: "NA", "K", "CL", "CO2", "GLUCOSE", "BUN", "CREATININE", "CALCIUM", "GFRNONAA", "GFRAA"  BNP No results found for: "BNP"  ProBNP No results found for: "PROBNP"  Imaging: No results found.   Assessment & Plan:   OSA (obstructive sleep apnea) - Sleep study greater than 10 years, results not available in chart.  Previously had difficulty completing home sleep study.  Recent weight gain.  Epworth 13.  BMI 53.  He will need an in lab split-night sleep study to reassess OSA.  Patient is open to CPAP if needed.  We reviewed risks of untreated sleep apnea including cardiac arrhythmias, pulmonary hypertension, diabetes and stroke.  We also discussed treatment options including weight loss, oral appliance, CPAP therapy or referral to ENT for possible surgical options.  Encourage side sleeping position.  Advised against driving if experiencing excessive daytime sleepiness fatigue.  Follow-up 1 to 2 weeks after sleep study review results or treatment options if needed.   [name], NP 05/12/2023

## 2023-05-12 NOTE — Patient Instructions (Addendum)
  Sleep apnea is defined as period of 10 seconds or longer when you stop breathing at night. This can happen multiple times a night. Dx sleep apnea is when this occurs more than 5 times an hour.    Mild OSA 5-15 apneic events an hour Moderate OSA 15-30 apneic events an hour Severe OSA > 30 apneic events an hour   Untreated sleep apnea puts you at higher risk for cardiac arrhythmias, pulmonary HTN, stroke and diabetes  Treatment options include weight loss, side sleeping position, oral appliance, CPAP therapy or referral to ENT for possible surgical options    Recommendations: Focus on side sleeping position or elevate head with wedge pillow 30 degrees Work on weight loss efforts if able  Do not drive if experiencing excessive daytime sleepiness of fatigue    Orders: Split night sleep study re: hx OSA   Follow-up: Please call to schedule follow-up 1-2 weeks after completing home sleep study to review results and treatment if needed (can be virtual)

## 2023-05-12 NOTE — Progress Notes (Signed)
Reviewed and agree with assessment/plan.   Coralyn Helling, MD [facility] Pulmonary/Critical Care 05/12/2023, 12:57 PM Pager:  [phone]

## 2023-06-13 ENCOUNTER — Ambulatory Visit (HOSPITAL_BASED_OUTPATIENT_CLINIC_OR_DEPARTMENT_OTHER): Payer: Medicare Other | Attending: Primary Care | Admitting: Pulmonary Disease

## 2023-06-13 DIAGNOSIS — G4733 Obstructive sleep apnea (adult) (pediatric): Secondary | ICD-10-CM | POA: Diagnosis not present

## 2023-06-29 DIAGNOSIS — G4733 Obstructive sleep apnea (adult) (pediatric): Secondary | ICD-10-CM

## 2023-06-29 NOTE — Procedures (Signed)
     Patient Name: Victor Garrison, Victor Garrison Date: 06/13/2023 Gender: Male D.O.B: 03-Oct-1951 Age (years): 35 Referring Provider: Ames Dura NP Height (inches): 70 Interpreting Physician: Coralyn Helling MD, ABSM Weight (lbs): 375 RPSGT: Cherylann Parr BMI: 54 MRN: [id number] Neck Size: 18.00  CLINICAL INFORMATION The patient is referred for a split night study with BPAP.  MEDICATIONS Medications self-administered by patient taken the night of the study : N/A  SLEEP STUDY TECHNIQUE As per the AASM Manual for the Scoring of Sleep and Associated Events v2.3 (April 2016) with a hypopnea requiring 4% desaturations.  The channels recorded and monitored were frontal, central and occipital EEG, electrooculogram (EOG), submentalis EMG (chin), nasal and oral airflow, thoracic and abdominal wall motion, anterior tibialis EMG, snore microphone, electrocardiogram, and pulse oximetry. Bi-level positive airway pressure (BiPAP) was initiated when the patient met split night criteria and was titrated according to treat sleep-disordered breathing.  RESPIRATORY PARAMETERS Diagnostic  Total AHI (/hr): 93.5 RDI (/hr): 93.5 OA Index (/hr): 79.8 CA Index (/hr): 5.9 REM AHI (/hr): N/A NREM AHI (/hr): 93.5 Supine AHI (/hr): N/A Non-supine AHI (/hr): 93.54 Min O2 Sat (%): 85.0 Mean O2 (%): 94.6 Time below 88% (min): 1.9   Titration  Optimal IPAP Pressure (cm): 16 Optimal EPAP Pressure (cm): 12 AHI at Optimal Pressure (/hr): 0 Min O2 at Optimal Pressure (%): 89.0 Sleep % at Optimal (%): 100 Supine % at Optimal (%): 0   SLEEP ARCHITECTURE The study was initiated at 10:32:59 PM and terminated at 4:34:55 AM. The total recorded time was 361.9 minutes. EEG confirmed total sleep time was 279.6 minutes yielding a sleep efficiency of 77.3%. Sleep onset after lights out was 28.3 minutes with a REM latency of 241.0 minutes. The patient spent 3.4% of the night in stage N1 sleep, 81.6% in stage N2 sleep, 0.0% in  stage N3 and 15% in REM. Wake after sleep onset (WASO) was 54.0 minutes. The Arousal Index was 4.3/hour.  LEG MOVEMENT DATA The total Periodic Limb Movements of Sleep (PLMS) were 0. The PLMS index was 0.0 .  CARDIAC DATA The 2 lead EKG demonstrated sinus rhythm. The mean heart rate was 100.0 beats per minute. Other EKG findings include: None.  IMPRESSIONS - Severe obstructive sleep apnea with an AHI of 93.5 and SpO2 low of 85%. - He continue to have respiratory events while using CPAP. - He did well with Bipap 16/12 cm H2O. - He did not require supplemental oxygen during this study.  DIAGNOSIS - Obstructive Sleep Apnea (G47.33)  RECOMMENDATIONS - Trial of BiPAP therapy on 16/12 cm H2O with a Large size Fisher&Paykel Full Face Simplus mask and heated humidification. - Avoid alcohol, sedatives and other CNS depressants that may worsen sleep apnea and disrupt normal sleep architecture. - Sleep hygiene should be reviewed to assess factors that may improve sleep quality. - Weight management and regular exercise should be initiated or continued.  [Electronically signed] 06/23/2023 11:30 AM  Coralyn Helling MD, ABSM Diplomate, American Board of Sleep Medicine NPI: [id number]  [facility] PH: [phone]   FX: [phone] ACCREDITED BY THE AMERICAN ACADEMY OF SLEEP MEDICINE

## 2023-07-12 ENCOUNTER — Ambulatory Visit (INDEPENDENT_AMBULATORY_CARE_PROVIDER_SITE_OTHER): Payer: Medicare Other | Admitting: Podiatry

## 2023-07-12 DIAGNOSIS — M79674 Pain in right toe(s): Secondary | ICD-10-CM | POA: Diagnosis not present

## 2023-07-12 DIAGNOSIS — M79675 Pain in left toe(s): Secondary | ICD-10-CM

## 2023-07-12 DIAGNOSIS — B351 Tinea unguium: Secondary | ICD-10-CM | POA: Diagnosis not present

## 2023-07-12 NOTE — Progress Notes (Signed)
This patient presents to the office with chief complaint of long thick painful nails.  Patient says the nails are painful walking and wearing shoes.  This patient is unable to self treat.  This patient is unable to trim her nails since she is unable to reach her nails.  She presents to the office for preventative foot care services.  General Appearance  Alert, conversant and in no acute stress.  Vascular  Dorsalis pedis and posterior tibial  pulses are weakly  palpable due to swelling. bilaterally.  Capillary return is within normal limits  bilaterally. Temperature is within normal limits  bilaterally.  Neurologic  Senn-Weinstein monofilament wire test within normal limits  bilaterally. Muscle power within normal limits bilaterally.  Nails Thick disfigured discolored nails with subungual debris  from hallux to fifth toes bilaterally. No evidence of bacterial infection or drainage bilaterally.  Orthopedic  No limitations of motion  feet .  No crepitus or effusions noted.  No bony pathology or digital deformities noted.  Skin  normotropic skin with no porokeratosis noted bilaterally.  No signs of infections or ulcers noted.     Onychomycosis  Nails  B/L.  Pain in right toes  Pain in left toes  Debridement of nails both feet followed trimming the nails with dremel tool.    RTC 3 months.   Gardiner Barefoot DPM

## 2023-08-05 DIAGNOSIS — D508 Other iron deficiency anemias: Secondary | ICD-10-CM | POA: Diagnosis not present

## 2023-08-05 DIAGNOSIS — E559 Vitamin D deficiency, unspecified: Secondary | ICD-10-CM | POA: Diagnosis not present

## 2023-08-05 DIAGNOSIS — R7303 Prediabetes: Secondary | ICD-10-CM | POA: Diagnosis not present

## 2023-08-05 DIAGNOSIS — I10 Essential (primary) hypertension: Secondary | ICD-10-CM | POA: Diagnosis not present

## 2023-08-05 DIAGNOSIS — E782 Mixed hyperlipidemia: Secondary | ICD-10-CM | POA: Diagnosis not present

## 2023-08-05 DIAGNOSIS — I119 Hypertensive heart disease without heart failure: Secondary | ICD-10-CM | POA: Diagnosis not present

## 2023-08-05 DIAGNOSIS — G4733 Obstructive sleep apnea (adult) (pediatric): Secondary | ICD-10-CM | POA: Diagnosis not present

## 2023-08-05 DIAGNOSIS — B359 Dermatophytosis, unspecified: Secondary | ICD-10-CM | POA: Diagnosis not present

## 2023-09-20 ENCOUNTER — Telehealth: Payer: Self-pay | Admitting: Primary Care

## 2023-09-20 NOTE — Telephone Encounter (Signed)
Needs OSA follow-up to review sleep study, please set up/ virtual ok

## 2023-09-22 NOTE — Telephone Encounter (Signed)
Called patient and made virtual OV MyChart visit (double book) for Friday 09/24/2023 at 9:30am to review sleep study per [facility].  Patient verbalized understanding.

## 2023-09-24 ENCOUNTER — Telehealth: Payer: Medicare Other | Admitting: Primary Care

## 2023-09-24 DIAGNOSIS — G4733 Obstructive sleep apnea (adult) (pediatric): Secondary | ICD-10-CM | POA: Diagnosis not present

## 2023-09-24 NOTE — Progress Notes (Signed)
Virtual Visit via Video Note  I connected with Victor Garrison on 09/24/23 at  9:30 AM EST by a video enabled telemedicine application and verified that I am speaking with the correct person using two identifiers.  Location: Patient: Home Provider: Office    I discussed the limitations of evaluation and management by telemedicine and the availability of in person appointments. The patient expressed understanding and agreed to proceed.  History of Present Illness:  72 year old male, former smoker quit 1997.  Past medical history significant for OSA, HTN, prediabetes, iron deficiency, vit D deficiency, obesity.  Previous [facility] pulmonary encounter: 05/12/2023 Patient presents today for sleep consult. He reports history of severe sleep apnea. Never started on CPAP. No known cardiac hx. He tells me he has been given a diagnosis of COPD, former smoker quit 20 years. Not on maintenance bronchodilators. He had prior home sleep study but had difficulty completing and follow instructions. Sleep pattern varies. He falls asleep and naps during the day. Typical bedtime 11pm-2am. No issues falling asleep. He wakes up 4-5 times at night to use restroom. He starts his day between 6:30-8am. He works at a group home, he provides residential housing. No concern for narcolepsy, cataplexy and sleep walking.   Sleep questionnaire Symptoms- woken himself up snoring, restless sleep, daytime sleepiness  Prior sleep study- >10 years ago [place]  Bedtime-11PM-2AM Time to fall asleep- Quickly  Nocturnal awakenings- 4-5 times  Out of bed/start of day- 6:30-8am Weight changes- 30 lbs  Do you operate heavy machinery- no Do you currently wear CPAP- no Do you current wear oxygen- no Epworth- 13    09/24/2023- interim hx  Discussed the use of AI scribe software for clinical note transcription with the patient, who gave verbal consent to proceed.  History of Present Illness   Patient contacted today for a  follow-up post in-lab sleep study.  He reports a history of snoring, restless sleep, and moderate daytime sleepiness, with an Epworth Sleepiness Scale score of 13/24. The patient has not previously used a CPAP machine due to financial constraints.  Split night sleep study in July revealed severe sleep apnea, with an average of 93 apneic events per hour and a minimum oxygen saturation of 85%. The patient responded well to BiPAP during the study, with no significant residual apneic events at a setting of 16/12 cmH2O.  The patient expressed understanding of the need for BiPAP therapy and is willing to proceed, despite previous financial concerns. He was informed about the machine's maintenance, compliance requirements, and the potential benefits of therapy, including improved sleep quality and daytime energy levels. The patient will follow-up in six to eight weeks post initiation of BiPAP therapy.     Observations/Objective:  Appears well; No overt resp symptoms   Split night sleep study in July 2024 showed severe obstructive sleep apnea corrected with BiPAP 16/12 cm H2O.  He did not require supplemental oxygen.  Assessment and Plan:  1. OSA (obstructive sleep apnea) - AMB REFERRAL FOR DME  Severe Sleep Apnea Split night sleep study in July 2024 showed severe obstructive sleep apnea corrected with BiPAP 16/12 cm H2O.  He did not require supplemental oxygen. Patient has never been on CPAP or BiPAP before. Discussed the benefits of BiPAP and the patient agreed to start therapy. -Initiate BiPAP with settings of IPAP 16 cmH2O and EPAP 12 cmH2O. -Advised to wear BiPAP every night 4-6 hours  -Encourage weight loss efforts   Follow Up Instructions:  -Follow-up in 6-8 weeks  after initiation of BiPAP therapy to assess compliance and effectiveness.  I discussed the assessment and treatment plan with the patient. The patient was provided an opportunity to ask questions and all were answered. The  patient agreed with the plan and demonstrated an understanding of the instructions.   The patient was advised to call back or seek an in-person evaluation if the symptoms worsen or if the condition fails to improve as anticipated.  I provided 22 minutes of non-face-to-face time during this encounter.   [name], NP

## 2023-09-24 NOTE — Patient Instructions (Addendum)
Sleep study showed severe obstructive sleep apnea corrected with BiPAP 16/12 cm H2O.  You did not require supplemental oxygen. Aim to wear BiPAP nightly 4 to 6 hours or longer Work on weight loss efforts as able  Orders: New BIPAP machine pressure IPAP 16/ EPAP 12cm h20 With large size F&P full face simplus mask and heated humidification  Enroll in airview   Follow-up: 6-8 weeks with Victor Sandy NP (virtual ok) - please call and scheduled

## 2023-10-01 DIAGNOSIS — M25562 Pain in left knee: Secondary | ICD-10-CM | POA: Diagnosis not present

## 2023-10-01 DIAGNOSIS — R262 Difficulty in walking, not elsewhere classified: Secondary | ICD-10-CM | POA: Diagnosis not present

## 2023-10-01 DIAGNOSIS — M25561 Pain in right knee: Secondary | ICD-10-CM | POA: Diagnosis not present

## 2023-10-01 DIAGNOSIS — M170 Bilateral primary osteoarthritis of knee: Secondary | ICD-10-CM | POA: Diagnosis not present

## 2023-10-08 DIAGNOSIS — R262 Difficulty in walking, not elsewhere classified: Secondary | ICD-10-CM | POA: Diagnosis not present

## 2023-10-08 DIAGNOSIS — M25561 Pain in right knee: Secondary | ICD-10-CM | POA: Diagnosis not present

## 2023-10-08 DIAGNOSIS — M25562 Pain in left knee: Secondary | ICD-10-CM | POA: Diagnosis not present

## 2023-10-08 DIAGNOSIS — M170 Bilateral primary osteoarthritis of knee: Secondary | ICD-10-CM | POA: Diagnosis not present

## 2023-10-13 ENCOUNTER — Encounter: Payer: Self-pay | Admitting: Podiatry

## 2023-10-13 ENCOUNTER — Ambulatory Visit: Payer: Medicare Other | Admitting: Podiatry

## 2023-10-13 DIAGNOSIS — M79674 Pain in right toe(s): Secondary | ICD-10-CM

## 2023-10-13 DIAGNOSIS — B351 Tinea unguium: Secondary | ICD-10-CM

## 2023-10-13 DIAGNOSIS — R200 Anesthesia of skin: Secondary | ICD-10-CM

## 2023-10-13 DIAGNOSIS — R202 Paresthesia of skin: Secondary | ICD-10-CM

## 2023-10-13 DIAGNOSIS — M79675 Pain in left toe(s): Secondary | ICD-10-CM

## 2023-10-13 NOTE — Progress Notes (Signed)

## 2023-10-22 ENCOUNTER — Encounter: Payer: Self-pay | Admitting: Podiatry

## 2023-10-22 ENCOUNTER — Ambulatory Visit (INDEPENDENT_AMBULATORY_CARE_PROVIDER_SITE_OTHER): Payer: Medicare Other | Admitting: Podiatry

## 2023-10-22 VITALS — Ht 70.0 in | Wt 375.0 lb

## 2023-10-22 DIAGNOSIS — R262 Difficulty in walking, not elsewhere classified: Secondary | ICD-10-CM | POA: Diagnosis not present

## 2023-10-22 DIAGNOSIS — M170 Bilateral primary osteoarthritis of knee: Secondary | ICD-10-CM | POA: Diagnosis not present

## 2023-10-22 DIAGNOSIS — M25561 Pain in right knee: Secondary | ICD-10-CM | POA: Diagnosis not present

## 2023-10-22 DIAGNOSIS — R202 Paresthesia of skin: Secondary | ICD-10-CM

## 2023-10-22 DIAGNOSIS — M25562 Pain in left knee: Secondary | ICD-10-CM | POA: Diagnosis not present

## 2023-10-22 DIAGNOSIS — Z8739 Personal history of other diseases of the musculoskeletal system and connective tissue: Secondary | ICD-10-CM | POA: Diagnosis not present

## 2023-10-22 DIAGNOSIS — R200 Anesthesia of skin: Secondary | ICD-10-CM | POA: Diagnosis not present

## 2023-10-22 NOTE — Progress Notes (Signed)
  Subjective:  Patient ID: Victor Garrison, male    DOB: [birth year],  MRN: [id number]  Chief Complaint  Patient presents with   Numbness    Patient is here for foot    72 y.o. male presents with the above complaint.  Patient presents with follow-up of bilateral numbness tingling both feet he states is doing a lot better.  He has been able to manage it with just stretching and physical therapy   Review of Systems: Negative except as noted in the HPI. Denies N/V/F/Ch.  Past Medical History:  Diagnosis Date   Hyperlipidemia    Sleep apnea     Current Outpatient Medications:    Aspirin-Salicylamide-Caffeine (BC HEADACHE POWDER PO), Take by mouth., Disp: , Rfl:    Cholecalciferol (VITAMIN D-3) 5000 UNITS TABS, Take 1 capsule by mouth daily., Disp: , Rfl:    furosemide (LASIX) 20 MG tablet, Take 20 mg by mouth daily., Disp: , Rfl:    Green Tea, Camellia sinensis, (GREEN TEA PO), Take by mouth., Disp: , Rfl:    Multiple Vitamins-Minerals (MULTIVITAMIN WITH MINERALS) tablet, Take 1 tablet by mouth daily., Disp: , Rfl:    testosterone cypionate (DEPOTESTOTERONE CYPIONATE) 200 MG/ML injection, Inject 200 mg into the muscle every 28 (twenty-eight) days., Disp: , Rfl:   Social History   Tobacco Use  Smoking Status Former   Current packs/day: 0.00   Average packs/day: 0.6 packs/day for 4.0 years (2.5 ttl pk-yrs)   Types: Cigarettes   Start date: 11/17/1991   Quit date: 11/17/1995   Years since quitting: 27.9  Smokeless Tobacco Former    Allergies  Allergen Reactions   Penicillins    Objective:  There were no vitals filed for this visit. Body mass index is 53.81 kg/m. Constitutional Well developed. Well nourished.  Vascular Dorsalis pedis pulses palpable bilaterally. Posterior tibial pulses palpable bilaterally. Capillary refill normal to all digits.  No cyanosis or clubbing noted. Pedal hair growth normal.  Neurologic Normal speech. Oriented to person, place, and  time. Epicritic sensation to light touch grossly present bilaterally.  Negative tarsal tunnel syndrome.  Negative common peroneal nerve entrapment  Dermatologic Nails within normal limits Skin within the limits  Orthopedic: Normal joint ROM without pain or crepitus bilaterally. No visible deformities. No bony tenderness.   Radiographs: None Assessment:   No diagnosis found.  Plan:  Patient was evaluated and treated and all questions answered.  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  If any foot and ankle issues on future he will come back and see me. No follow-ups on file.

## 2023-10-29 DIAGNOSIS — M25562 Pain in left knee: Secondary | ICD-10-CM | POA: Diagnosis not present

## 2023-10-29 DIAGNOSIS — M25561 Pain in right knee: Secondary | ICD-10-CM | POA: Diagnosis not present

## 2023-10-29 DIAGNOSIS — M170 Bilateral primary osteoarthritis of knee: Secondary | ICD-10-CM | POA: Diagnosis not present

## 2023-10-29 DIAGNOSIS — R262 Difficulty in walking, not elsewhere classified: Secondary | ICD-10-CM | POA: Diagnosis not present

## 2023-11-04 DIAGNOSIS — N3941 Urge incontinence: Secondary | ICD-10-CM | POA: Diagnosis not present

## 2023-11-04 DIAGNOSIS — I10 Essential (primary) hypertension: Secondary | ICD-10-CM | POA: Diagnosis not present

## 2023-11-04 DIAGNOSIS — G4733 Obstructive sleep apnea (adult) (pediatric): Secondary | ICD-10-CM | POA: Diagnosis not present

## 2023-11-04 DIAGNOSIS — Z0001 Encounter for general adult medical examination with abnormal findings: Secondary | ICD-10-CM | POA: Diagnosis not present

## 2023-11-04 DIAGNOSIS — N3001 Acute cystitis with hematuria: Secondary | ICD-10-CM | POA: Diagnosis not present

## 2023-11-04 DIAGNOSIS — E559 Vitamin D deficiency, unspecified: Secondary | ICD-10-CM | POA: Diagnosis not present

## 2023-11-04 DIAGNOSIS — E782 Mixed hyperlipidemia: Secondary | ICD-10-CM | POA: Diagnosis not present

## 2023-11-04 DIAGNOSIS — R7303 Prediabetes: Secondary | ICD-10-CM | POA: Diagnosis not present

## 2023-11-04 DIAGNOSIS — Z0000 Encounter for general adult medical examination without abnormal findings: Secondary | ICD-10-CM | POA: Diagnosis not present

## 2023-11-04 DIAGNOSIS — D508 Other iron deficiency anemias: Secondary | ICD-10-CM | POA: Diagnosis not present

## 2023-11-04 DIAGNOSIS — I119 Hypertensive heart disease without heart failure: Secondary | ICD-10-CM | POA: Diagnosis not present

## 2023-11-04 DIAGNOSIS — B359 Dermatophytosis, unspecified: Secondary | ICD-10-CM | POA: Diagnosis not present

## 2023-11-05 DIAGNOSIS — M25561 Pain in right knee: Secondary | ICD-10-CM | POA: Diagnosis not present

## 2023-11-05 DIAGNOSIS — R262 Difficulty in walking, not elsewhere classified: Secondary | ICD-10-CM | POA: Diagnosis not present

## 2023-11-05 DIAGNOSIS — M25562 Pain in left knee: Secondary | ICD-10-CM | POA: Diagnosis not present

## 2023-11-05 DIAGNOSIS — M170 Bilateral primary osteoarthritis of knee: Secondary | ICD-10-CM | POA: Diagnosis not present

## 2023-11-19 DIAGNOSIS — M170 Bilateral primary osteoarthritis of knee: Secondary | ICD-10-CM | POA: Diagnosis not present

## 2023-11-19 DIAGNOSIS — M25561 Pain in right knee: Secondary | ICD-10-CM | POA: Diagnosis not present

## 2023-11-19 DIAGNOSIS — R262 Difficulty in walking, not elsewhere classified: Secondary | ICD-10-CM | POA: Diagnosis not present

## 2023-11-19 DIAGNOSIS — M25562 Pain in left knee: Secondary | ICD-10-CM | POA: Diagnosis not present

## 2023-11-25 ENCOUNTER — Telehealth: Payer: Self-pay | Admitting: Primary Care

## 2023-11-25 ENCOUNTER — Encounter: Payer: Medicare Other | Admitting: Primary Care

## 2023-11-25 ENCOUNTER — Telehealth: Payer: Self-pay | Admitting: *Deleted

## 2023-11-25 NOTE — Telephone Encounter (Signed)
 Cancel today's visit due to incorrect pressure settings. An ordered was placed for BiPAP 16/12 cm H2O during his last visit on 09/24/23, appears patient is currently on CPAP at 16 cm H2O. He feels pressure is too strong. We have contacted [employer] and we have been told that a respiratory therapist would change pressure settings to intended BIPAP pressure 16/12cm h20.   Airview download 10/25/2023 - 11/23/2023 Usage 30/30 days (100%); 28 days (93%) Average usage 5 hours 46 minutes Pressure 16 cm H2O Air leak 16.8 L/min (95%) AHI 14.8  We will reschedule todays visit for 2-4 weeks after we re-adjust pressure setting

## 2023-11-25 NOTE — Telephone Encounter (Signed)
 Called pt.'s DME=[facility] about an order placed on 09/24/2023 for BIPAP 16/12. Here today for f/u ov. Download appears still on CPAP. Dme stated it would be changed correctly by a Respiratory Therapist today. Notified Almarie Ferrari, NP and pt. Of this conversation.

## 2023-11-25 NOTE — Progress Notes (Deleted)
 Virtual Visit via Video Note  I connected with Victor Garrison on 11/25/23 at  9:00 AM EST by a video enabled telemedicine application and verified that I am speaking with the correct person using two identifiers.  Location: Patient: *** Provider: ***   I discussed the limitations of evaluation and management by telemedicine and the availability of in person appointments. The patient expressed understanding and agreed to proceed.  History of Present Illness:  73 year old male, former smoker quit 1997.  Past medical history significant for OSA, HTN, prediabetes, iron deficiency, vit D deficiency, obesity.  Previous [facility] pulmonary encounter: 05/12/2023 Patient presents today for sleep consult. He reports history of severe sleep apnea. Never started on CPAP. No known cardiac hx. He tells me he has been given a diagnosis of COPD, former smoker quit 20 years. Not on maintenance bronchodilators. He had prior home sleep study but had difficulty completing and follow instructions. Sleep pattern varies. He falls asleep and naps during the day. Typical bedtime 11pm-2am. No issues falling asleep. He wakes up 4-5 times at night to use restroom. He starts his day between 6:30-8am. He works at a group home, he provides residential housing. No concern for narcolepsy, cataplexy and sleep walking.   Sleep questionnaire Symptoms- woken himself up snoring, restless sleep, daytime sleepiness  Prior sleep study- >10 years ago [place]  Bedtime-11PM-2AM Time to fall asleep- Quickly  Nocturnal awakenings- 4-5 times  Out of bed/start of day- 6:30-8am Weight changes- 30 lbs  Do you operate heavy machinery- no Do you currently wear CPAP- no Do you current wear oxygen- no Epworth- 13    09/24/2023- interim hx  Discussed the use of AI scribe software for clinical note transcription with the patient, who gave verbal consent to proceed.  History of Present Illness   Patient contacted today for a follow-up  post in-lab sleep study.  He reports a history of snoring, restless sleep, and moderate daytime sleepiness, with an Epworth Sleepiness Scale score of 13/24. The patient has not previously used a CPAP machine due to financial constraints.  Split night sleep study in July revealed severe sleep apnea, with an average of 93 apneic events per hour and a minimum oxygen saturation of 85%. The patient responded well to BiPAP during the study, with no significant residual apneic events at a setting of 16/12 cmH2O.  The patient expressed understanding of the need for BiPAP therapy and is willing to proceed, despite previous financial concerns. He was informed about the machine's maintenance, compliance requirements, and the potential benefits of therapy, including improved sleep quality and daytime energy levels. The patient will follow-up in six to eight weeks post initiation of BiPAP therapy.    Severe Sleep Apnea Split night sleep study in July 2024 showed severe obstructive sleep apnea with 93 apneic events per hours and minimum oxygen saturation of 85%; corrected with BiPAP 16/12 cm H2O.  He did not require supplemental oxygen. Patient has never been on CPAP or BiPAP before. Discussed the benefits of BiPAP and the patient agreed to start therapy. -Initiate BiPAP with settings of IPAP 16 cmH2O and EPAP 12 cmH2O. -Advised to wear BiPAP every night 4-6 hours  -Encourage weight loss efforts     11/25/2023- Interim hx  Patient contacted today for virtual visit/ BIPAP compliance. An ordered was placed for BiPAP 16/12 cm H2O during his last visit on 09/24/23, appears patient is currently on CPAP at 16 cm H2O. He feels pressure is too strong. We have contacted [facility]  and we have been told that a respiratory therapist would change pressure settings to intended BIPAP pressure 16/12cm h20.    Airview download 10/25/2023 - 11/23/2023 Usage 30/30 days (100%); 28 days (93%) Average usage 5 hours 46 minutes Pressure 16 cm  H2O Air leak 16.8 L/min (95%) AHI 14.8  Observations/Objective:   Assessment and Plan:   Follow Up Instructions:    I discussed the assessment and treatment plan with the patient. The patient was provided an opportunity to ask questions and all were answered. The patient agreed with the plan and demonstrated an understanding of the instructions.   The patient was advised to call back or seek an in-person evaluation if the symptoms worsen or if the condition fails to improve as anticipated.  I provided *** minutes of non-face-to-face time during this encounter.   Almarie LELON Ferrari, NP

## 2023-11-26 DIAGNOSIS — M25562 Pain in left knee: Secondary | ICD-10-CM | POA: Diagnosis not present

## 2023-11-26 DIAGNOSIS — M170 Bilateral primary osteoarthritis of knee: Secondary | ICD-10-CM | POA: Diagnosis not present

## 2023-11-26 DIAGNOSIS — M25561 Pain in right knee: Secondary | ICD-10-CM | POA: Diagnosis not present

## 2023-11-26 DIAGNOSIS — R262 Difficulty in walking, not elsewhere classified: Secondary | ICD-10-CM | POA: Diagnosis not present

## 2023-11-26 NOTE — Telephone Encounter (Signed)
 Appointment rescheduled to 12/13/2023- patient aware

## 2023-12-01 DIAGNOSIS — I10 Essential (primary) hypertension: Secondary | ICD-10-CM | POA: Diagnosis not present

## 2023-12-01 DIAGNOSIS — N3000 Acute cystitis without hematuria: Secondary | ICD-10-CM | POA: Diagnosis not present

## 2023-12-01 DIAGNOSIS — E782 Mixed hyperlipidemia: Secondary | ICD-10-CM | POA: Diagnosis not present

## 2023-12-01 DIAGNOSIS — D508 Other iron deficiency anemias: Secondary | ICD-10-CM | POA: Diagnosis not present

## 2023-12-01 DIAGNOSIS — R7303 Prediabetes: Secondary | ICD-10-CM | POA: Diagnosis not present

## 2023-12-01 DIAGNOSIS — G4733 Obstructive sleep apnea (adult) (pediatric): Secondary | ICD-10-CM | POA: Diagnosis not present

## 2023-12-01 DIAGNOSIS — E559 Vitamin D deficiency, unspecified: Secondary | ICD-10-CM | POA: Diagnosis not present

## 2023-12-01 DIAGNOSIS — N3941 Urge incontinence: Secondary | ICD-10-CM | POA: Diagnosis not present

## 2023-12-13 ENCOUNTER — Telehealth: Payer: Self-pay

## 2023-12-13 ENCOUNTER — Telehealth (INDEPENDENT_AMBULATORY_CARE_PROVIDER_SITE_OTHER): Payer: Medicare Other | Admitting: Primary Care

## 2023-12-13 ENCOUNTER — Encounter: Payer: Self-pay | Admitting: Primary Care

## 2023-12-13 VITALS — Ht 70.0 in | Wt 305.0 lb

## 2023-12-13 DIAGNOSIS — G4733 Obstructive sleep apnea (adult) (pediatric): Secondary | ICD-10-CM

## 2023-12-13 NOTE — Progress Notes (Signed)
Virtual Visit via Video Note  I connected with Victor Garrison on 12/13/23 at 10:00 AM EST by a video enabled telemedicine application and verified that I am speaking with the correct person using two identifiers.  Location: Patient: Home Provider: Office   I discussed the limitations of evaluation and management by telemedicine and the availability of in person appointments. The patient expressed understanding and agreed to proceed.  History of Present Illness: 73 year old male, former smoker quit 1997.  Past medical history significant for OSA, HTN, prediabetes, iron deficiency, vit D deficiency, obesity.  Pressure [facility] pulmonary  05/12/2023 Patient presents today for sleep consult. He reports history of severe sleep apnea. Never started on CPAP. No known cardiac hx. He tells me he has been given a diagnosis of COPD, former smoker quit 20 years. Not on maintenance bronchodilators. He had prior home sleep study but had difficulty completing and follow instructions. Sleep pattern varies. He falls asleep and naps during the day. Typical bedtime 11pm-2am. No issues falling asleep. He wakes up 4-5 times at night to use restroom. He starts his day between 6:30-8am. He works at a group home, he provides residential housing. No concern for narcolepsy, cataplexy and sleep walking.   Sleep questionnaire Symptoms- woken himself up snoring, restless sleep, daytime sleepiness  Prior sleep study- >10 years ago [name]  Bedtime-11PM-2AM Time to fall asleep- Quickly  Nocturnal awakenings- 4-5 times  Out of bed/start of day- 6:30-8am Weight changes- 30 lbs  Do you operate heavy machinery- no Do you currently wear CPAP- no Do you current wear oxygen- no Epworth- 13    09/24/2023 Discussed the use of AI scribe software for clinical note transcription with the patient, who gave verbal consent to proceed.   History of Present Illness   Patient contacted today for a follow-up post in-lab sleep  study.  He reports a history of snoring, restless sleep, and moderate daytime sleepiness, with an Epworth Sleepiness Scale score of 13/24. The patient has not previously used a CPAP machine due to financial constraints.   Split night sleep study in July revealed severe sleep apnea, with an average of 93 apneic events per hour and a minimum oxygen saturation of 85%. The patient responded well to BiPAP during the study, with no significant residual apneic events at a setting of 16/12 cmH2O.   The patient expressed understanding of the need for BiPAP therapy and is willing to proceed, despite previous financial concerns. He was informed about the machine's maintenance, compliance requirements, and the potential benefits of therapy, including improved sleep quality and daytime energy levels. The patient will follow-up in six to eight weeks post initiation of BiPAP therapy.    11/25/23- Visit cancelled, patient not on intended pressure settings. Contacted DME to change from CPAP to BIPAP as originally prescribed in November.    12/13/2023- Interim hx  Discussed the use of AI scribe software for clinical note transcription with the patient, who gave verbal consent to proceed.  History of Present Illness   The patient, with a history of sleep apnea, presents for a follow-up consultation regarding CPAP compliance. He was initially seen for a sleep consult in June 2024, and a subsequent sleep study in July confirmed a severe sleep apnea diagnosis with approximately 93 apneic events per hour. An ordered was placed for BiPAP 16/12 cm H2O during an office visit on 09/24/23, we have contacted [employer] in January and we have been told that a respiratory therapist would change pressure settings to intended  BIPAP pressure 16/12cm h20.   As of today, he remains on CPAP at 16cm h20, feels pressure is too strong at times.  The patient reports a typical bedtime between 11 PM and 1 AM, with no issues falling asleep. However, he  wakes up four to five times a night and starts his day between 6:30 and 8 AM. He reports that the CPAP machine fell off his nightstand twice, and the pressure increased to 16, causing discomfort. The patient has to remove the mask once or twice a night due to the high pressure, after which the machine recycles back down to about three.  Despite these issues, the patient has noticed significant benefits from the CPAP therapy. He reports feeling less tired, having fewer nocturnal bathroom trips, and experiencing more consistent, uninterrupted sleep. He also notes improved alertness and focus since starting the therapy.      Airview download 11/10/23-12/09/23 Usage 29/30 days: 27 days (90%) greater than 4 hours 5 hours 34 minutes Pressure 16 cm H2O Air leaks 23.7 L/min (95%) AHI 10.3  Observations/Objective:  Appears well without overt respiratory symptoms   Assessment and Plan:  1. Severe obstructive sleep apnea (Primary)     Obstructive Sleep Apnea Severe with 93 apneic events per hour. Currently on CPAP with pressure up to 16 causing discomfort and leading to intermittent mask removal. Notes improvement in sleep quality and daytime alertness since starting PAP therapy. -Contact medical supply store to correct machine settings to BiPAP 16/12cm h20  Follow Up Instructions:  -Follow up in 6-8 weeks to assess compliance and effectiveness of new settings.       I discussed the assessment and treatment plan with the patient. The patient was provided an opportunity to ask questions and all were answered. The patient agreed with the plan and demonstrated an understanding of the instructions.   The patient was advised to call back or seek an in-person evaluation if the symptoms worsen or if the condition fails to improve as anticipated.  I provided 22 minutes of non-face-to-face time during this encounter.   [name], NP

## 2023-12-13 NOTE — Telephone Encounter (Signed)
Please schedule a 6-8 week f/u with Waynetta Sandy, NP for Bipap compliance

## 2023-12-13 NOTE — Telephone Encounter (Signed)
I called [facility] and spoke with Richard, RT. I informed him that the pt is on a Bipap machine and the DL are still showing Cpap. I informed Richard that Cameron, NP needs this fixed. Richard verbalized understanding and stated he would fix this. NFN

## 2023-12-13 NOTE — Telephone Encounter (Signed)
I called [facility] in regards to pts cpap machine. I spoke with a man who stated that the RT was in session currently and he would have them call me when they were free to discuss matters on this pt. Pt was suppose to be put on Bipap and it looks like the pt is still on cpap. A RT was notified and told to change the pts settings and this was not done.

## 2023-12-14 NOTE — Telephone Encounter (Signed)
Patient is scheduled 03/13 at 9:30am.

## 2024-01-17 ENCOUNTER — Ambulatory Visit (INDEPENDENT_AMBULATORY_CARE_PROVIDER_SITE_OTHER): Payer: Medicare Other | Admitting: Podiatry

## 2024-01-17 ENCOUNTER — Encounter: Payer: Self-pay | Admitting: Podiatry

## 2024-01-17 DIAGNOSIS — M79675 Pain in left toe(s): Secondary | ICD-10-CM

## 2024-01-17 DIAGNOSIS — M79674 Pain in right toe(s): Secondary | ICD-10-CM

## 2024-01-17 DIAGNOSIS — B351 Tinea unguium: Secondary | ICD-10-CM | POA: Diagnosis not present

## 2024-01-17 NOTE — Progress Notes (Addendum)
 This patient presents to the office with chief complaint of long thick painful nails.  Patient says the nails are painful walking and wearing shoes.  This patient is unable to self treat.  This patient is unable to trim his nails since he is unable to reach his nails.  he presents to the office for preventative foot care services.  General Appearance  Alert, conversant and in no acute stress.  Vascular  Dorsalis pedis and posterior tibial  pulses are weakly  palpable due to swelling. bilaterally.  Capillary return is within normal limits  bilaterally. Temperature is within normal limits  bilaterally.  Neurologic  Senn-Weinstein monofilament wire test within normal limits  bilaterally. Muscle power within normal limits bilaterally.  Nails Thick disfigured discolored nails with subungual debris  from hallux to fifth toes bilaterally. No evidence of bacterial infection or drainage bilaterally.  Orthopedic  No limitations of motion  feet .  No crepitus or effusions noted.  No bony pathology or digital deformities noted.  Skin  normotropic skin with no porokeratosis noted bilaterally.  No signs of infections or ulcers noted.     Onychomycosis  Nails  B/L.  Pain in right toes  Pain in left toes  Debridement of nails both feet followed trimming the nails with dremel tool.    RTC 3 months.   Helane Gunther DPM

## 2024-01-26 ENCOUNTER — Other Ambulatory Visit: Payer: Self-pay

## 2024-01-26 DIAGNOSIS — I872 Venous insufficiency (chronic) (peripheral): Secondary | ICD-10-CM

## 2024-01-27 ENCOUNTER — Ambulatory Visit: Payer: Medicare Other | Admitting: Primary Care

## 2024-01-31 DIAGNOSIS — M170 Bilateral primary osteoarthritis of knee: Secondary | ICD-10-CM | POA: Diagnosis not present

## 2024-02-03 DIAGNOSIS — E559 Vitamin D deficiency, unspecified: Secondary | ICD-10-CM | POA: Diagnosis not present

## 2024-02-03 DIAGNOSIS — N3941 Urge incontinence: Secondary | ICD-10-CM | POA: Diagnosis not present

## 2024-02-03 DIAGNOSIS — D508 Other iron deficiency anemias: Secondary | ICD-10-CM | POA: Diagnosis not present

## 2024-02-03 DIAGNOSIS — G4733 Obstructive sleep apnea (adult) (pediatric): Secondary | ICD-10-CM | POA: Diagnosis not present

## 2024-02-03 DIAGNOSIS — R7303 Prediabetes: Secondary | ICD-10-CM | POA: Diagnosis not present

## 2024-02-03 DIAGNOSIS — E782 Mixed hyperlipidemia: Secondary | ICD-10-CM | POA: Diagnosis not present

## 2024-02-03 DIAGNOSIS — I10 Essential (primary) hypertension: Secondary | ICD-10-CM | POA: Diagnosis not present

## 2024-02-04 ENCOUNTER — Ambulatory Visit: Payer: Medicare Other | Admitting: Physician Assistant

## 2024-02-04 ENCOUNTER — Ambulatory Visit (HOSPITAL_COMMUNITY)
Admission: RE | Admit: 2024-02-04 | Discharge: 2024-02-04 | Disposition: A | Discharge status: Home / Self Care | Payer: Medicare Other | Source: Ambulatory Visit | Attending: Vascular Surgery | Admitting: Vascular Surgery

## 2024-02-04 VITALS — BP 139/87 | HR 84 | Temp 98.3°F | Ht 70.0 in | Wt 366.4 lb

## 2024-02-04 DIAGNOSIS — I890 Lymphedema, not elsewhere classified: Secondary | ICD-10-CM

## 2024-02-04 DIAGNOSIS — I872 Venous insufficiency (chronic) (peripheral): Secondary | ICD-10-CM | POA: Diagnosis not present

## 2024-02-04 NOTE — Progress Notes (Signed)
 Office Note     CC:  follow up Requesting Provider:  Norm Salt, PA  HPI: Victor Garrison is a 73 y.o. (04/14/1951) male who presents for evaluation of right lower extremity edema.  He denies any history of DVT, venous ulcerations, trauma, or prior vascular interventions.  He does not wear compression.  He elevates his leg when possible during the day.  He unfortunately has been gaining weight however will be starting [facility] soon.  He denies tobacco use.  He was referred by his PCP for evaluation of edema.   Past Medical History:  Diagnosis Date   Hyperlipidemia    Sleep apnea     History reviewed. No pertinent surgical history.  Social History   Socioeconomic History   Marital status: Divorced    Spouse name: Not on file   Number of children: Not on file   Years of education: Not on file   Highest education level: Not on file  Occupational History   Occupation: care provider    Comment: self employed  Tobacco Use   Smoking status: Former    Current packs/day: 0.00    Average packs/day: 0.6 packs/day for 4.0 years (2.5 ttl pk-yrs)    Types: Cigarettes    Start date: 11/17/1991    Quit date: 11/17/1995    Years since quitting: 28.2   Smokeless tobacco: Former  Substance and Sexual Activity   Alcohol use: Yes    Comment: social   Drug use: No   Sexual activity: Not on file  Other Topics Concern   Not on file  Social History Narrative   Not on file   Social Drivers of Health   Financial Resource Strain: Not on file  Food Insecurity: Not on file  Transportation Needs: Not on file  Physical Activity: Not on file  Stress: Not on file  Social Connections: Unknown (03/30/2022)   Received from [facility]   Social Network    Social Network: Not on file  Intimate Partner Violence: Unknown (02/19/2022)   Received from [facility], [facility]   HITS    Physically Hurt: Not on file    Insult or Talk Down To: Not on file    Threaten Physical  Harm: Not on file    Scream or Curse: Not on file    Family History  Problem Relation Age of Onset   Lung cancer Mother    Lung cancer Father    Lung cancer Brother    COPD Brother    Hypertension Mother     Current Outpatient Medications  Medication Sig Dispense Refill   Aspirin-Salicylamide-Caffeine (BC HEADACHE POWDER PO) Take by mouth.     Cholecalciferol (VITAMIN D-3) 5000 UNITS TABS Take 1 capsule by mouth daily.     Green Tea, Camellia sinensis, (GREEN TEA PO) Take by mouth.     Multiple Vitamins-Minerals (MULTIVITAMIN WITH MINERALS) tablet Take 1 tablet by mouth daily.     No current facility-administered medications for this visit.    Allergies  Allergen Reactions   Penicillins      REVIEW OF SYSTEMS:   [X]  denotes positive finding, [ ]  denotes negative finding Cardiac  Comments:  Chest pain or chest pressure:    Shortness of breath upon exertion:    Short of breath when lying flat:    Irregular heart rhythm:        Vascular    Pain in calf, thigh, or hip brought on by ambulation:  Pain in feet at night that wakes you up from your sleep:     Blood clot in your veins:    Leg swelling:         Pulmonary    Oxygen at home:    Productive cough:     Wheezing:         Neurologic    Sudden weakness in arms or legs:     Sudden numbness in arms or legs:     Sudden onset of difficulty speaking or slurred speech:    Temporary loss of vision in one eye:     Problems with dizziness:         Gastrointestinal    Blood in stool:     Vomited blood:         Genitourinary    Burning when urinating:     Blood in urine:        Psychiatric    Major depression:         Hematologic    Bleeding problems:    Problems with blood clotting too easily:        Skin    Rashes or ulcers:        Constitutional    Fever or chills:      PHYSICAL EXAMINATION:  Vitals:   02/04/24 1246  BP: 139/87  Pulse: 84  Temp: 98.3 F (36.8 C)  TempSrc: Temporal  SpO2:  97%  Weight: (!) 366 lb 6.4 oz (166.2 kg)  Height: 5\' 10"  (1.778 m)    General:  WDWN in NAD; vital signs documented above Gait: Not observed HENT: WNL, normocephalic Pulmonary: normal non-labored breathing , without Rales, rhonchi,  wheezing Cardiac: regular HR Abdomen: soft, NT, no masses Skin: without rashes Vascular Exam/Pulses: palpable DP pulses Extremities: Lymphedema involving ankles, feet, and toes of bilateral lower extremities right worse than left; no ulcerations Musculoskeletal: no muscle wasting or atrophy  Neurologic: A&O X 3 Psychiatric:  The pt has Normal affect.   Non-Invasive Vascular Imaging:    Right lower extremity venous reflux study negative for DVT Mild reflux in the GSV at the saphenofemoral junction and at the level of the knee   ASSESSMENT/PLAN:: 73 y.o. male here for evaluation of bilateral lower extremity edema right worse than left  Based on physical exam [name] has lymphedema affecting both lower legs right worse than left.  Right lower extremity venous reflux study was negative for DVT.  He does have mild superficial insufficiency however would not be a candidate for laser ablation.  I stressed the importance of managing swelling with regular use of compression socks, periodic leg elevation during the day, weight loss, and exercise.  He may also be a candidate for referral to a lymphedema clinic however would defer this to his PCP.  For now he will follow-up on an as-needed basis.   Emilie Rutter, PA-C [facility] [phone]  Clinic MD:   Randie Heinz on call

## 2024-03-06 ENCOUNTER — Telehealth: Payer: Self-pay

## 2024-03-06 NOTE — Telephone Encounter (Signed)
 Pt does not have a more accurate compliance report for his appointment with Irby Mannan, NP on 03-07-24. I tried calling pt, unable to reach, unable to lvm due to being full.

## 2024-03-07 ENCOUNTER — Ambulatory Visit: Admitting: Primary Care

## 2024-03-07 ENCOUNTER — Encounter: Payer: Self-pay | Admitting: Primary Care

## 2024-03-07 NOTE — Progress Notes (Deleted)
 @Patient  ID: Victor Garrison, male    DOB: 14-Jan-1951, 73 y.o.   MRN: [id number]  No chief complaint on file.   Referring provider: Tretha Fu, MD  HPI:  73 year old male, former smoker quit 1997.  Past medical history significant for OSA, HTN, prediabetes, iron deficiency, vit D deficiency, obesity.  Pressure LB pulmonary  05/12/2023 Patient presents today for sleep consult. He reports history of severe sleep apnea. Never started on CPAP. No known cardiac hx. He tells me he has been given a diagnosis of COPD, former smoker quit 20 years. Not on maintenance bronchodilators. He had prior home sleep study but had difficulty completing and follow instructions. Sleep pattern varies. He falls asleep and naps during the day. Typical bedtime 11pm-2am. No issues falling asleep. He wakes up 4-5 times at night to use restroom. He starts his day between 6:30-8am. He works at a group home, he provides residential housing. No concern for narcolepsy, cataplexy and sleep walking.   Sleep questionnaire Symptoms- woken himself up snoring, restless sleep, daytime sleepiness  Prior sleep study- >10 years ago [facility]  Bedtime-11PM-2AM Time to fall asleep- Quickly  Nocturnal awakenings- 4-5 times  Out of bed/start of day- 6:30-8am Weight changes- 30 lbs  Do you operate heavy machinery- no Do you currently wear CPAP- no Do you current wear oxygen- no Epworth- 13    09/24/2023 Discussed the use of AI scribe software for clinical note transcription with the patient, who gave verbal consent to proceed.   History of Present Illness   Patient contacted today for a follow-up post in-lab sleep study.  He reports a history of snoring, restless sleep, and moderate daytime sleepiness, with an Epworth Sleepiness Scale score of 13/24. The patient has not previously used a CPAP machine due to financial constraints.   Split night sleep study in July revealed severe sleep apnea, with an average of 93  apneic events per hour and a minimum oxygen saturation of 85%. The patient responded well to BiPAP during the study, with no significant residual apneic events at a setting of 16/12 cmH2O.   The patient expressed understanding of the need for BiPAP therapy and is willing to proceed, despite previous financial concerns. He was informed about the machine's maintenance, compliance requirements, and the potential benefits of therapy, including improved sleep quality and daytime energy levels. The patient will follow-up in six to eight weeks post initiation of BiPAP therapy.    11/25/23- Visit cancelled, patient not on intended pressure settings. Contacted DME to change from CPAP to BIPAP as originally prescribed in November.    12/13/2023 Discussed the use of AI scribe software for clinical note transcription with the patient, who gave verbal consent to proceed.  History of Present Illness   The patient, with a history of sleep apnea, presents for a follow-up consultation regarding CPAP compliance. He was initially seen for a sleep consult in June 2024, and a subsequent sleep study in July confirmed a severe sleep apnea diagnosis with approximately 93 apneic events per hour. An ordered was placed for BiPAP 16/12 cm H2O during an office visit on 09/24/23, we have contacted [employer] in January and we have been told that a respiratory therapist would change pressure settings to intended BIPAP pressure 16/12cm h20.   As of today, he remains on CPAP at 16cm h20, feels pressure is too strong at times.  The patient reports a typical bedtime between 11 PM and 1 AM, with no issues falling asleep. However, he wakes  up four to five times a night and starts his day between 6:30 and 8 AM. He reports that the CPAP machine fell off his nightstand twice, and the pressure increased to 16, causing discomfort. The patient has to remove the mask once or twice a night due to the high pressure, after which the machine recycles back  down to about three.  Despite these issues, the patient has noticed significant benefits from the CPAP therapy. He reports feeling less tired, having fewer nocturnal bathroom trips, and experiencing more consistent, uninterrupted sleep. He also notes improved alertness and focus since starting the therapy.      Airview download 11/10/23-12/09/23 Usage 29/30 days: 27 days (90%) greater than 4 hours 5 hours 34 minutes Pressure 16 cm H2O Air leaks 23.7 L/min (95%) AHI 10.3  Obstructive Sleep Apnea Severe with 93 apneic events per hour. Currently on CPAP with pressure up to 16 causing discomfort and leading to intermittent mask removal. Notes improvement in sleep quality and daytime alertness since starting PAP therapy. -Contact medical supply store to correct machine settings to BiPAP 16/12cm h20     03/07/2024   Here for BIPAP compliance    Allergies  Allergen Reactions   Penicillins     Immunization History  Administered Date(s) Administered   Influenza Split 08/16/2013    Past Medical History:  Diagnosis Date   Hyperlipidemia    Sleep apnea     Tobacco History: Social History   Tobacco Use  Smoking Status Former   Current packs/day: 0.00   Average packs/day: 0.6 packs/day for 4.0 years (2.5 ttl pk-yrs)   Types: Cigarettes   Start date: 11/17/1991   Quit date: 11/17/1995   Years since quitting: 28.3  Smokeless Tobacco Former   Counseling given: Not Answered   Outpatient Medications Prior to Visit  Medication Sig Dispense Refill   Aspirin-Salicylamide-Caffeine (BC HEADACHE POWDER PO) Take by mouth.     Cholecalciferol (VITAMIN D-3) 5000 UNITS TABS Take 1 capsule by mouth daily.     Green Tea, Camellia sinensis, (GREEN TEA PO) Take by mouth.     Multiple Vitamins-Minerals (MULTIVITAMIN WITH MINERALS) tablet Take 1 tablet by mouth daily.     No facility-administered medications prior to visit.      Review of Systems  Review of Systems   Physical  Exam  There were no vitals taken for this visit. Physical Exam   Lab Results:  CBC No results found for: "WBC", "RBC", "HGB", "HCT", "PLT", "MCV", "MCH", "MCHC", "RDW", "LYMPHSABS", "MONOABS", "EOSABS", "BASOSABS"  BMET No results found for: "NA", "K", "CL", "CO2", "GLUCOSE", "BUN", "CREATININE", "CALCIUM", "GFRNONAA", "GFRAA"  BNP No results found for: "BNP"  ProBNP No results found for: "PROBNP"  Imaging: No results found.   Assessment & Plan:   No problem-specific Assessment & Plan notes found for this encounter.     Antonio Baumgarten, NP 03/07/2024

## 2024-04-19 ENCOUNTER — Ambulatory Visit: Admitting: Podiatry

## 2024-04-21 ENCOUNTER — Ambulatory Visit: Payer: Medicare (Managed Care) | Admitting: Podiatry

## 2024-04-21 DIAGNOSIS — R202 Paresthesia of skin: Secondary | ICD-10-CM

## 2024-04-21 DIAGNOSIS — R200 Anesthesia of skin: Secondary | ICD-10-CM | POA: Diagnosis not present

## 2024-04-21 NOTE — Progress Notes (Signed)
  Subjective:  Patient ID: Victor Garrison, male    DOB: 10-28-1951,  MRN: [id number]  Chief Complaint  Patient presents with   Numbness    72 y.o. male presents with the above complaint.  Patient presents with bilateral numbness tingling to both feet that has been on for some years is progressive gotten worse.  He would like to discuss other treatment options.  Rest of the HPI as above.   Review of Systems: Negative except as noted in the HPI. Denies N/V/F/Ch.  Past Medical History:  Diagnosis Date   Hyperlipidemia    Sleep apnea     Current Outpatient Medications:    Aspirin-Salicylamide-Caffeine (BC HEADACHE POWDER PO), Take by mouth., Disp: , Rfl:    Cholecalciferol (VITAMIN D-3) 5000 UNITS TABS, Take 1 capsule by mouth daily., Disp: , Rfl:    Green Tea, Camellia sinensis, (GREEN TEA PO), Take by mouth., Disp: , Rfl:    Multiple Vitamins-Minerals (MULTIVITAMIN WITH MINERALS) tablet, Take 1 tablet by mouth daily., Disp: , Rfl:   Social History   Tobacco Use  Smoking Status Former   Current packs/day: 0.00   Average packs/day: 0.6 packs/day for 4.0 years (2.5 ttl pk-yrs)   Types: Cigarettes   Start date: 11/17/1991   Quit date: 11/17/1995   Years since quitting: 28.4  Smokeless Tobacco Former    Allergies  Allergen Reactions   Penicillins    Objective:  There were no vitals filed for this visit. There is no height or weight on file to calculate BMI. Constitutional Well developed. Well nourished.  Vascular Dorsalis pedis pulses palpable bilaterally. Posterior tibial pulses palpable bilaterally. Capillary refill normal to all digits.  No cyanosis or clubbing noted. Pedal hair growth normal.  Neurologic Normal speech. Oriented to person, place, and time. Epicritic sensation to light touch grossly present bilaterally.  Negative tarsal tunnel syndrome.  Negative common peroneal nerve entrapment  Dermatologic Nails within normal limits Skin within the limits   Orthopedic: Normal joint ROM without pain or crepitus bilaterally. No visible deformities. No bony tenderness.   Radiographs: None Assessment:   1. Numbness and tingling    Plan:  Patient was evaluated and treated and all questions answered.  Bilateral numbness tingling with history of low back pain -All questions or concerns were discussed with the patient in extensive detail -I believe the likely etiology of this is his lower back pain likely compressing the nerve and causing both foot and ankle to be completely numb.  I discussed with patient he states understanding.  He will benefit from seeing chronic pain management specialist.  Referral was made to Dr. Rhesa Celeste at [facility]. No follow-ups on file.

## 2024-05-04 ENCOUNTER — Ambulatory Visit
Payer: Medicare (Managed Care) | Attending: Student in an Organized Health Care Education/Training Program | Admitting: Student in an Organized Health Care Education/Training Program

## 2024-05-04 ENCOUNTER — Encounter: Payer: Self-pay | Admitting: Student in an Organized Health Care Education/Training Program

## 2024-05-04 VITALS — BP 114/77 | HR 90 | Temp 97.3°F | Resp 18 | Ht 70.0 in | Wt 363.0 lb

## 2024-05-04 DIAGNOSIS — R202 Paresthesia of skin: Secondary | ICD-10-CM | POA: Insufficient documentation

## 2024-05-04 DIAGNOSIS — G894 Chronic pain syndrome: Secondary | ICD-10-CM | POA: Insufficient documentation

## 2024-05-04 DIAGNOSIS — M79601 Pain in right arm: Secondary | ICD-10-CM | POA: Insufficient documentation

## 2024-05-04 DIAGNOSIS — M79602 Pain in left arm: Secondary | ICD-10-CM | POA: Diagnosis not present

## 2024-05-04 MED ORDER — GABAPENTIN 100 MG PO CAPS: ORAL_CAPSULE | ORAL | 0 refills | Status: DC

## 2024-05-04 NOTE — Progress Notes (Signed)
 PROVIDER NOTE: Interpretation of information contained herein should be left to medically-trained personnel. Specific patient instructions are provided elsewhere under Patient Instructions section of medical record. This document was created in part using AI and STT-dictation technology, any transcriptional errors that may result from this process are unintentional.  Patient: Victor Garrison  Service: E/M Encounter  Provider: Cephus Collin, MD  DOB: June 30, 1951  Delivery: Face-to-face  Specialty: Interventional Pain Management  MRN: [id number]  Setting: Ambulatory outpatient facility  Specialty designation: 09  Type: New Patient  Location: Outpatient office facility  PCP: Tretha Fu, MD  DOS: 05/04/2024    Referring Prov.: Velma Ghazi, DPM   Primary Reason(s) for Visit: Encounter for initial evaluation of one or more chronic problems (new to examiner) potentially causing chronic pain, and posing a threat to normal musculoskeletal function. (Level of risk: High) CC: Tingling and Numbness (Both feet and both hand, no pain)  HPI  [name] is a 73 y.o. year old, male patient, who comes for the first time to our practice referred by Velma Ghazi, DPM for our initial evaluation of his chronic pain. He has OSA (obstructive sleep apnea); Paresthesia and pain of both upper extremities; and Chronic pain syndrome on their problem list. Today he comes in for evaluation of his Tingling and Numbness (Both feet and both hand, no pain)  Pain Assessment: Location:     Radiating: tingling and numbness in both hands and both feet Onset: More than a month ago Duration: Neuropathic pain Quality: Numbness, Tingling Severity: 0-No pain/10 (subjective, self-reported pain score)  Effect on ADL:   Timing: Intermittent Modifying factors: tea BP: 114/77  HR: 90  Onset and Duration: Gradual and Present longer than 3 months Cause of pain: Unknown Severity: No change since onset, NAS-11 at its worse:  8/10, NAS-11 at its best: 2/10, NAS-11 now: 2/10, and NAS-11 on the average: 4/10 Timing: Afternoon and Night Aggravating Factors: Climbing, Kneeling, Motion, Prolonged sitting, and Prolonged standing Alleviating Factors: Lying down, Resting, Sleeping, and Using a brace Associated Problems: Numbness and Tingling Quality of Pain: Dull, Pressure-like, Shooting, Throbbing, Tingling, and Uncomfortable Previous Examinations or Tests: Endoscopy, X-rays, and Chiropractic evaluation Previous Treatments: The patient denies any previous treatemnts  Victor Garrison is being evaluated for possible interventional pain management therapies for the treatment of his chronic pain.  Discussed the use of AI scribe software for clinical note transcription with the patient, who gave verbal consent to proceed.  History of Present Illness   Victor Garrison is a 73 year old male who presents with numbness and tingling in his hands and feet. He was referred by Dr. Lydia Sams for evaluation of numbness and tingling in his hands and feet.  He has been experiencing numbness and tingling in his hands and feet for approximately six to seven years. There is no history of diabetes, and he has not tried any medications for these symptoms. Shifting his position helps alleviate the numbness and tingling, particularly when sitting. He has previously consulted a vein specialist who found no issues with his circulation.  He occasionally experiences trouble sleeping, which he attributes partly to pain and irregular sleep patterns over the last two years. He suspects that his sciatic nerve and weight issues may contribute to his discomfort. He has been attempting to lose weight to address these concerns.  There is a family history of poor circulation, specifically in his grandfather.        Meds   Current Outpatient Medications:  Aspirin-Salicylamide-Caffeine (BC HEADACHE POWDER PO), Take by mouth., Disp: , Rfl:    Cholecalciferol  (VITAMIN D-3) 5000 UNITS TABS, Take 1 capsule by mouth daily., Disp: , Rfl:    gabapentin (NEURONTIN) 100 MG capsule, Take 1 capsule (100 mg total) by mouth 2 (two) times daily for 30 days, THEN 2 capsules (200 mg total) 2 (two) times daily for 30 days, THEN 3 capsules (300 mg total) 2 (two) times daily., Disp: 360 capsule, Rfl: 0   Green Tea, Camellia sinensis, (GREEN TEA PO), Take by mouth., Disp: , Rfl:    Multiple Vitamins-Minerals (MULTIVITAMIN WITH MINERALS) tablet, Take 1 tablet by mouth daily., Disp: , Rfl:   Imaging Review   ROS  Cardiovascular: Heart murmur Pulmonary or Respiratory: Smoking, Snoring , Coughing up mucus (Bronchitis), and Temporary stoppage of breathing during sleep Neurological: No reported neurological signs or symptoms such as seizures, abnormal skin sensations, urinary and/or fecal incontinence, being born with an abnormal open spine and/or a tethered spinal cord Psychological-Psychiatric: No reported psychological or psychiatric signs or symptoms such as difficulty sleeping, anxiety, depression, delusions or hallucinations (schizophrenial), mood swings (bipolar disorders) or suicidal ideations or attempts Gastrointestinal: No reported gastrointestinal signs or symptoms such as vomiting or evacuating blood, reflux, heartburn, alternating episodes of diarrhea and constipation, inflamed or scarred liver, or pancreas or irrregular and/or infrequent bowel movements Genitourinary: No reported renal or genitourinary signs or symptoms such as difficulty voiding or producing urine, peeing blood, non-functioning kidney, kidney stones, difficulty emptying the bladder, difficulty controlling the flow of urine, or chronic kidney disease Hematological: No reported hematological signs or symptoms such as prolonged bleeding, low or poor functioning platelets, bruising or bleeding easily, hereditary bleeding problems, low energy levels due to low hemoglobin or being anemic Endocrine: No  reported endocrine signs or symptoms such as high or low blood sugar, rapid heart rate due to high thyroid  levels, obesity or weight gain due to slow thyroid  or thyroid  disease Rheumatologic: No reported rheumatological signs and symptoms such as fatigue, joint pain, tenderness, swelling, redness, heat, stiffness, decreased range of motion, with or without associated rash Musculoskeletal: Negative for myasthenia gravis, muscular dystrophy, multiple sclerosis or malignant hyperthermia Work History: Retired  Allergies  Victor Garrison is allergic to penicillins.  Laboratory Chemistry Profile   Renal No results found for: BUN, CREATININE, LABCREA, BCR, GFR, GFRAA, GFRNONAA, SPECGRAV, PHUR, PROTEINUR   Electrolytes No results found for: NA, K, CL, CALCIUM, MG, PHOS   Hepatic No results found for: AST, ALT, ALBUMIN, ALKPHOS, AMYLASE, LIPASE, AMMONIA   ID Lab Results  Component Value Date   SARSCOV2NAA Not Detected 10/06/2019     Bone No results found for: VD25OH, [id number], [id number], [id number], 25OHVITD1, 25OHVITD2, 25OHVITD3, TESTOFREE, TESTOSTERONE   Endocrine No results found for: GLUCOSE, GLUCOSEU, HGBA1C, TSH, FREET4, TESTOFREE, TESTOSTERONE, SHBG, ESTRADIOL, ESTRADIOLPCT, ESTRADIOLFRE, LABPREG, ACTH, CRTSLPL, UCORFRPERLTR, UCORFRPERDAY, CORTISOLBASE   Neuropathy No results found for: VITAMINB12, FOLATE, HGBA1C, HIV   CNS No results found for: COLORCSF, APPEARCSF, RBCCOUNTCSF, WBCCSF, POLYSCSF, LYMPHSCSF, EOSCSF, PROTEINCSF, GLUCCSF, JCVIRUS, CSFOLI, IGGCSF, LABACHR, ACETBL   Inflammation (CRP: Acute  ESR: Chronic) No results found for: CRP, ESRSEDRATE, LATICACIDVEN   Rheumatology No results found for: RF, ANA, LABURIC, URICUR, LYMEIGGIGMAB, LYMEABIGMQN, HLAB27   Coagulation No results found for: INR, LABPROT, APTT,  PLT, DDIMER, LABHEMA, VITAMINK1, AT3   Cardiovascular No results found for: BNP, CKTOTAL, CKMB, TROPONINI, HGB, HCT, LABVMA, EPIRU, [id number], NOREPRU, [id number], DOPARU, [id number]   Screening Lab Results  Component Value Date   SARSCOV2NAA Not  Detected 10/06/2019     Cancer No results found for: CEA, CA125, LABCA2   Allergens No results found for: ALMOND, APPLE, ASPARAGUS, AVOCADO, BANANA, BARLEY, BASIL, BAYLEAF, GREENBEAN, LIMABEAN, WHITEBEAN, BEEFIGE, REDBEET, BLUEBERRY, BROCCOLI, CABBAGE, MELON, CARROT, CASEIN, CASHEWNUT, CAULIFLOWER, CELERY     Note: Lab results reviewed.  PFSH  Drug: Victor Garrison  reports no history of drug use. Alcohol :  reports current alcohol  use. Tobacco:  reports that he quit smoking about 28 years ago. His smoking use included cigarettes. He started smoking about 32 years ago. He has a 2.5 pack-year smoking history. He has quit using smokeless tobacco. Medical:  has a past medical history of Hyperlipidemia and Sleep apnea. Family: family history includes COPD in his brother; Hypertension in his mother; Lung cancer in his brother, father, and mother.  History reviewed. No pertinent surgical history. Active Ambulatory Problems    Diagnosis Date Noted   OSA (obstructive sleep apnea) 02/07/2014   Paresthesia and pain of both upper extremities 05/04/2024   Chronic pain syndrome 05/04/2024   Resolved Ambulatory Problems    Diagnosis Date Noted   No Resolved Ambulatory Problems   Past Medical History:  Diagnosis Date   Hyperlipidemia    Sleep apnea    Constitutional Exam  General appearance: Well nourished, well developed, and well hydrated. In no apparent acute distress Vitals:   05/04/24 0914 05/04/24 0920  BP: (!) 131/100 114/77  Pulse: 90   Resp: 18   Temp: (!) 97.3 F (36.3 C)   TempSrc: Temporal   SpO2: 96%   Weight: (!) 363 lb (164.7 kg)    Height: 5' 10 (1.778 m)    BMI Assessment: Estimated body mass index is 52.09 kg/m as calculated from the following:   Height as of this encounter: 5' 10 (1.778 m).   Weight as of this encounter: 363 lb (164.7 kg).  BMI interpretation table: BMI level Category Range association with higher incidence of chronic pain  <18 kg/m2 Underweight   18.5-24.9 kg/m2 Ideal body weight   25-29.9 kg/m2 Overweight Increased incidence by 20%  30-34.9 kg/m2 Obese (Class I) Increased incidence by 68%  35-39.9 kg/m2 Severe obesity (Class II) Increased incidence by 136%  >40 kg/m2 Extreme obesity (Class III) Increased incidence by 254%   Patient's current BMI Ideal Body weight  Body mass index is 52.09 kg/m. Ideal body weight: 73 kg (160 lb 15 oz) Adjusted ideal body weight: 109.7 kg (241 lb 12.2 oz)   BMI Readings from Last 4 Encounters:  05/04/24 52.09 kg/m  02/04/24 52.57 kg/m  12/13/23 43.76 kg/m  10/22/23 53.81 kg/m   Wt Readings from Last 4 Encounters:  05/04/24 (!) 363 lb (164.7 kg)  02/04/24 (!) 366 lb 6.4 oz (166.2 kg)  12/13/23 (!) 305 lb (138.3 kg)  10/22/23 (!) 375 lb (170.1 kg)    Psych/Mental status: Alert, oriented x 3 (person, place, & time)       Eyes: PERLA Respiratory: No evidence of acute respiratory distress  Paresthesias of bilateral feet and hands  Patient presents today in wheelchair  Limited ambulation  Assessment  Primary Diagnosis & Pertinent Problem List: The primary encounter diagnosis was Paresthesias. Diagnoses of Paresthesia and pain of both upper extremities and Chronic pain syndrome were also pertinent to this visit.  Visit Diagnosis (New problems to examiner): 1. Paresthesias   2. Paresthesia and pain of both upper extremities   3. Chronic pain syndrome    Plan of Care (Initial workup plan)  Note: Victor Garrison was  reminded that as per protocol, today's visit has been an evaluation only. We have not taken over the patient's controlled  substance management.  Problem-specific plan: Assessment and Plan    Peripheral neuropathy   He experiences chronic numbness and tingling in his hands and feet for six to seven years, managed by changing positions. There is no diabetes. Gabapentin (Neurontin) is considered for treatment. He was informed about potential side effects, including drowsiness, sedation, mood changes, and leg swelling, especially at higher doses. Initiate gabapentin 100 mg BID for the first month, 200 mg BID for the second month, and 300 mg BID for the third month. Monitor for side effects. If side effects occur, revert to the previous dose and contact the provider.  Edema   He has significant leg swelling. A previous evaluation by a vein specialist showed no abnormalities. Potential causes related to vein and blood flow issues were discussed. MRI is not suitable for detecting capillary irregularities. Advise compression and elevation for leg swelling.  Sleep disturbance   He experiences occasional sleep disturbances over the last two years, partially due to pain and other unspecified factors. Irregular sleep patterns and potential contributing factors such as sciatic nerve issues and weight were discussed.       Future considerations include titration of gabapentin, Lyrica trial, Cymbalta trial  Since the patient is not a diabetic, Qutenza will not be approved.  He could be a candidate for spinal cord stimulation for lower extremity neuropathic pain however will need to lose weight before that.  Pharmacotherapy (current): Medications ordered:  Meds ordered this encounter  Medications   gabapentin (NEURONTIN) 100 MG capsule    Sig: Take 1 capsule (100 mg total) by mouth 2 (two) times daily for 30 days, THEN 2 capsules (200 mg total) 2 (two) times daily for 30 days, THEN 3 capsules (300 mg total) 2 (two) times daily.    Dispense:  360 capsule    Refill:  0   Medications administered during this visit: Victor Garrison had no medications administered during this visit.      Provider-requested follow-up: Return in about 3 months (around 08/04/2024) for Marthe Slain, MM.  Future Appointments  Date Time Provider Department Center  08/01/2024 10:00 AM Patel, Seema K, NP [facility] None  10/25/2024 10:15 AM Velma Ghazi, DPM [facility]   I discussed the assessment and treatment plan with the patient. The patient was provided an opportunity to ask questions and all were answered. The patient agreed with the plan and demonstrated an understanding of the instructions.  Patient advised to call back or seek an in-person evaluation if the symptoms or condition worsens.  Duration of encounter: .  Total time on encounter, as per AMA guidelines included both the face-to-face and non-face-to-face time personally spent by the physician and/or other qualified health care professional(s) on the day of the encounter (includes time in activities that require the physician or other qualified health care professional and does not include time in activities normally performed by clinical staff). Physician's time may include the following activities when performed: Preparing to see the patient (e.g., pre-charting review of records, searching for previously ordered imaging, lab work, and nerve conduction tests) Review of prior analgesic pharmacotherapies. Reviewing PMP Interpreting ordered tests (e.g., lab work, imaging, nerve conduction tests) Performing post-procedure evaluations, including interpretation of diagnostic procedures Obtaining and/or reviewing separately obtained history Performing a medically appropriate examination and/or evaluation Counseling and educating the patient/family/caregiver Ordering medications, tests, or procedures Referring and  communicating with other health care professionals (when not separately reported) Documenting clinical information in the electronic or other  health record Independently interpreting results (not separately reported) and communicating results to the patient/ family/caregiver Care coordination (not separately reported)  Note by: Cephus Collin, MD (TTS and AI technology used. I apologize for any typographical errors that were not detected and corrected.) Date: 05/04/2024; Time: 10:54 AM

## 2024-05-04 NOTE — Patient Instructions (Signed)
 New prescription for Gabapentin, tapered dose. Directions on the bottle.

## 2024-05-04 NOTE — Progress Notes (Signed)
 Safety precautions to be maintained throughout the outpatient stay will include: orient to surroundings, keep bed in low position, maintain call bell within reach at all times, provide assistance with transfer out of bed and ambulation.

## 2024-05-14 DIAGNOSIS — G4733 Obstructive sleep apnea (adult) (pediatric): Secondary | ICD-10-CM | POA: Diagnosis not present

## 2024-06-20 NOTE — Progress Notes (Signed)
 This encounter was created in error - please disregard.

## 2024-07-14 DIAGNOSIS — G4733 Obstructive sleep apnea (adult) (pediatric): Secondary | ICD-10-CM | POA: Diagnosis not present

## 2024-08-01 ENCOUNTER — Ambulatory Visit (HOSPITAL_BASED_OUTPATIENT_CLINIC_OR_DEPARTMENT_OTHER): Payer: Medicare (Managed Care) | Admitting: Nurse Practitioner

## 2024-08-01 DIAGNOSIS — G894 Chronic pain syndrome: Secondary | ICD-10-CM

## 2024-08-01 DIAGNOSIS — Z91199 Patient's noncompliance with other medical treatment and regimen due to unspecified reason: Secondary | ICD-10-CM

## 2024-08-01 NOTE — Progress Notes (Signed)
 08/01/2024-No show

## 2024-08-09 DIAGNOSIS — R635 Abnormal weight gain: Secondary | ICD-10-CM | POA: Diagnosis not present

## 2024-08-09 DIAGNOSIS — Z125 Encounter for screening for malignant neoplasm of prostate: Secondary | ICD-10-CM | POA: Diagnosis not present

## 2024-08-09 DIAGNOSIS — Z0001 Encounter for general adult medical examination with abnormal findings: Secondary | ICD-10-CM | POA: Diagnosis not present

## 2024-08-09 DIAGNOSIS — E559 Vitamin D deficiency, unspecified: Secondary | ICD-10-CM | POA: Diagnosis not present

## 2024-08-09 DIAGNOSIS — D509 Iron deficiency anemia, unspecified: Secondary | ICD-10-CM | POA: Diagnosis not present

## 2024-08-09 DIAGNOSIS — I889 Nonspecific lymphadenitis, unspecified: Secondary | ICD-10-CM | POA: Diagnosis not present

## 2024-08-09 DIAGNOSIS — Z1329 Encounter for screening for other suspected endocrine disorder: Secondary | ICD-10-CM | POA: Diagnosis not present

## 2024-08-09 DIAGNOSIS — I119 Hypertensive heart disease without heart failure: Secondary | ICD-10-CM | POA: Diagnosis not present

## 2024-08-09 DIAGNOSIS — Z136 Encounter for screening for cardiovascular disorders: Secondary | ICD-10-CM | POA: Diagnosis not present

## 2024-08-09 DIAGNOSIS — Z131 Encounter for screening for diabetes mellitus: Secondary | ICD-10-CM | POA: Diagnosis not present

## 2024-08-09 DIAGNOSIS — G4733 Obstructive sleep apnea (adult) (pediatric): Secondary | ICD-10-CM | POA: Diagnosis not present

## 2024-08-10 DIAGNOSIS — M542 Cervicalgia: Secondary | ICD-10-CM | POA: Diagnosis not present

## 2024-08-10 DIAGNOSIS — J029 Acute pharyngitis, unspecified: Secondary | ICD-10-CM | POA: Diagnosis not present

## 2024-08-10 DIAGNOSIS — Z87891 Personal history of nicotine dependence: Secondary | ICD-10-CM | POA: Diagnosis not present

## 2024-08-10 DIAGNOSIS — K0889 Other specified disorders of teeth and supporting structures: Secondary | ICD-10-CM | POA: Diagnosis not present

## 2024-08-11 ENCOUNTER — Ambulatory Visit: Payer: Self-pay

## 2024-08-11 NOTE — Telephone Encounter (Signed)
  Reason for Disposition  Toothache present > 24 hours  Answer Assessment - Initial Assessment Questions Went to PCP and ED, was given an antibiotic, not given anything for pain. Taking Tylenol with no relief. Patient does not have a dentist. Experiencing bleeding in that area, patient states he can taste it. Patient stated does not have a dentist, given info for [facility] in [place].  1. LOCATION: Which tooth is hurting?  (e.g., right-side/left-side, upper/lower, front/back)     Left bottom molar broke and has swelling and extreme pain   2. ONSET: When did the toothache start?  (e.g., hours, days)      2 weeks  3. SEVERITY: How bad is the toothache?  (Scale 1-10; mild, moderate or severe)     Severe 8/10-9/10, having trouble swallowing, stated couldn't even eat grits this morning, feels like the pain is on the back of his tongue.   4. SWELLING: Is there any visible swelling of your face?     No   5. OTHER SYMPTOMS: Do you have any other symptoms? (e.g., fever)     No  Protocols used: Toothache-A-AH  Copied from CRM #[id number]. Topic: Clinical - Red Word Triage >> Aug 11, 2024 11:15 AM Victor Garrison DEL wrote: Red Word that prompted transfer to Nurse Triage: pt had tooth break off on Tues.  Went to pcp on Wed, then to ED in [employer] wed morning. Given abx.  But severe pain in jaw still exist.  Left side pain.  Hurts to turn his head, and hurts to swallow.

## 2024-08-24 DIAGNOSIS — H6123 Impacted cerumen, bilateral: Secondary | ICD-10-CM | POA: Diagnosis not present

## 2024-08-24 DIAGNOSIS — R6884 Jaw pain: Secondary | ICD-10-CM | POA: Diagnosis not present

## 2024-08-24 DIAGNOSIS — I890 Lymphedema, not elsewhere classified: Secondary | ICD-10-CM | POA: Diagnosis not present

## 2024-08-24 DIAGNOSIS — B370 Candidal stomatitis: Secondary | ICD-10-CM | POA: Diagnosis not present

## 2024-08-24 DIAGNOSIS — Z6841 Body Mass Index (BMI) 40.0 and over, adult: Secondary | ICD-10-CM | POA: Diagnosis not present

## 2024-09-06 DIAGNOSIS — D509 Iron deficiency anemia, unspecified: Secondary | ICD-10-CM | POA: Diagnosis not present

## 2024-09-06 DIAGNOSIS — E559 Vitamin D deficiency, unspecified: Secondary | ICD-10-CM | POA: Diagnosis not present

## 2024-09-06 DIAGNOSIS — I119 Hypertensive heart disease without heart failure: Secondary | ICD-10-CM | POA: Diagnosis not present

## 2024-09-06 DIAGNOSIS — G4733 Obstructive sleep apnea (adult) (pediatric): Secondary | ICD-10-CM | POA: Diagnosis not present

## 2024-09-06 DIAGNOSIS — E782 Mixed hyperlipidemia: Secondary | ICD-10-CM | POA: Diagnosis not present

## 2024-09-13 DIAGNOSIS — G4733 Obstructive sleep apnea (adult) (pediatric): Secondary | ICD-10-CM | POA: Diagnosis not present

## 2024-09-18 NOTE — Progress Notes (Deleted)
 Victor Garrison CONSULT NOTE  Patient Care Team: Victor Bare, MD as PCP - General (Internal Medicine)  ASSESSMENT & PLAN:  Victor Garrison is a 73 y.o. male with history of hypertension, hyperlipidemia, OSA, vitamin D deficiency, obesity being seen for iron deficiency anemia.  Relevant history: History of *** Last colonoscopy: Last EGD: Periods:   The mechanism of IDA is due to either blood loss or decreased absorptive mechanism or both. We discussed some of the risks, benefits, and alternatives of intravenous iron infusions. The patient is symptomatic from anemia and the iron level is critically low. He tolerated oral iron supplement poorly and desires to achieved higher levels of iron faster for adequate hematopoesis. Some of the side-effects to be expected including risks of infusion reactions, phlebitis, headaches, nausea and fatigue.  The patient is willing to proceed. Patient education material was dispensed.   Ordering iv iron to be given at [address]  IV iron *** ordered. CBC, Iron, TIBC, ferritin Referral for colonoscopy and EGD  Assessment & Plan   No orders of the defined types were placed in this encounter.     All questions were answered. The patient knows to call the clinic with any problems, questions or concerns.  Victor JAYSON Chihuahua, MD 11/3/20255:11 PM   CHIEF COMPLAINTS/PURPOSE OF CONSULTATION:  Anemia  HISTORY OF PRESENTING ILLNESS:  Victor Garrison 73 y.o. male is here because of anemia.  Records show low ferritin. 09/10/2022 ferritin was 17.  Hemoglobin 12 MCV 76 03/18/2023 Ferritin was 13 hemoglobin 11.6 MCV 76 11/04/2023 ferritin was 20.  Hemoglobin 12 MCV 78. 08/09/24 hemoglobin 12.5 ferritin 20 iron saturation 12% MCV 82 RDW 16 platelet 290 WBC 6.9 normal potassium, calcium, total protein, albumin, globulin AST and ALT.  Victor Garrison had not noticed any recent bleeding such as melena, hematuria or hematochezia His last colonoscopy was  *** ***He had no prior history or diagnosis of cancer. His age appropriate screening programs are up-to-date. ***He denies any pica and eats a variety of diet. ***He denies blood donation or received blood transfusion ***The patient was prescribed oral iron supplements and he takes ***  MEDICAL HISTORY:  Past Medical History:  Diagnosis Date   Hyperlipidemia    Sleep apnea     SURGICAL HISTORY: No past surgical history on file.  SOCIAL HISTORY: Social History   Socioeconomic History   Marital status: Divorced    Spouse name: Not on file   Number of children: Not on file   Years of education: Not on file   Highest education level: Not on file  Occupational History   Occupation: care provider    Comment: self employed  Tobacco Use   Smoking status: Former    Current packs/day: 0.00    Average packs/day: 0.6 packs/day for 4.0 years (2.5 ttl pk-yrs)    Types: Cigarettes    Start date: 11/17/1991    Quit date: 11/17/1995    Years since quitting: 28.8   Smokeless tobacco: Former  Substance and Sexual Activity   Alcohol  use: Yes    Comment: social   Drug use: No   Sexual activity: Not on file  Other Topics Concern   Not on file  Social History Narrative   Not on file   Social Drivers of Health   Financial Resource Strain: Not on file  Food Insecurity: Not on file  Transportation Needs: Not on file  Physical Activity: Not on file  Stress: Not on file  Social Connections: Unknown (03/30/2022)  [facility]   Social Network    Social Network: Not on file  Intimate Partner Violence: Unknown (02/19/2022)   Received from [facility]   HITS    Physically Hurt: Not on file    Insult or Talk Down To: Not on file    Threaten Physical Harm: Not on file    Scream or Curse: Not on file    FAMILY HISTORY: Family History  Problem Relation Age of Onset   Lung cancer Mother    Lung cancer Father    Lung cancer Brother    COPD Brother    Hypertension Mother      ALLERGIES:  is allergic to penicillins.  MEDICATIONS:  Current Outpatient Medications  Medication Sig Dispense Refill   Aspirin-Salicylamide-Caffeine (BC HEADACHE POWDER PO) Take by mouth.     Cholecalciferol (VITAMIN D-3) 5000 UNITS TABS Take 1 capsule by mouth daily.     gabapentin  (NEURONTIN ) 100 MG capsule Take 1 capsule (100 mg total) by mouth 2 (two) times daily for 30 days, THEN 2 capsules (200 mg total) 2 (two) times daily for 30 days, THEN 3 capsules (300 mg total) 2 (two) times daily. 360 capsule 0   Green Tea, Camellia sinensis, (GREEN TEA PO) Take by mouth.     Multiple Vitamins-Minerals (MULTIVITAMIN WITH MINERALS) tablet Take 1 tablet by mouth daily.     No current facility-administered medications for this visit.    REVIEW OF SYSTEMS:   All relevant systems were reviewed with the patient and are negative.  PHYSICAL EXAMINATION: ECOG PERFORMANCE STATUS: {CHL ONC ECOG [phone]}  There were no vitals filed for this visit. There were no vitals filed for this visit.  GENERAL: alert, no distress and comfortable SKIN: skin color normal EYES: normal conjunctiva, sclera clear LUNGS: normal breathing effort HEART: regular rate & rhythm ABDOMEN: abdomen soft, non-tender and nondistended  RADIOGRAPHIC STUDIES: I have personally reviewed the radiological images as listed and agreed with the findings in the report. No results found.

## 2024-09-19 ENCOUNTER — Inpatient Hospital Stay: Payer: Medicare (Managed Care)

## 2024-09-27 ENCOUNTER — Inpatient Hospital Stay: Payer: Medicare (Managed Care)

## 2024-09-27 ENCOUNTER — Inpatient Hospital Stay: Payer: Medicare (Managed Care) | Admitting: Hematology and Oncology

## 2024-10-24 ENCOUNTER — Telehealth: Payer: Self-pay

## 2024-10-24 NOTE — Progress Notes (Signed)
   10/24/2024  Patient ID: Victor Garrison, male   DOB: [birth year], 73 y.o.   MRN: [id number]  Contacted patient regarding referral for medication management from Catalina Bare, MD .   Appointment scheduled 10/31/24 at 9:30am.   Heather Factor, PharmD Clinical Pharmacist  [phone]

## 2024-10-25 ENCOUNTER — Ambulatory Visit: Payer: Medicare (Managed Care) | Admitting: Podiatry

## 2024-10-25 DIAGNOSIS — B351 Tinea unguium: Secondary | ICD-10-CM

## 2024-10-25 DIAGNOSIS — M79674 Pain in right toe(s): Secondary | ICD-10-CM | POA: Diagnosis not present

## 2024-10-25 DIAGNOSIS — M79675 Pain in left toe(s): Secondary | ICD-10-CM | POA: Diagnosis not present

## 2024-10-25 NOTE — Progress Notes (Signed)
 This patient presents to the office with chief complaint of long thick painful nails.  Patient says the nails are painful walking and wearing shoes.  This patient is unable to self treat.  This patient is unable to trim his nails since he is unable to reach his nails.  he presents to the office for preventative foot care services.  General Appearance  Alert, conversant and in no acute stress.  Vascular  Dorsalis pedis and posterior tibial  pulses are weakly  palpable due to swelling. bilaterally.  Capillary return is within normal limits  bilaterally. Temperature is within normal limits  bilaterally.  Neurologic  Senn-Weinstein monofilament wire test within normal limits  bilaterally. Muscle power within normal limits bilaterally.  Nails Thick disfigured discolored nails with subungual debris  from hallux to fifth toes bilaterally. No evidence of bacterial infection or drainage bilaterally.  Orthopedic  No limitations of motion  feet .  No crepitus or effusions noted.  No bony pathology or digital deformities noted.  Skin  normotropic skin with no porokeratosis noted bilaterally.  No signs of infections or ulcers noted.     Onychomycosis  Nails  B/L.  Pain in right toes  Pain in left toes  Debridement of nails both feet followed trimming the nails with dremel tool.    RTC 3 months.   Franky Blanch D.P.M.

## 2024-10-27 ENCOUNTER — Encounter: Payer: Self-pay | Admitting: Oncology

## 2024-10-27 ENCOUNTER — Inpatient Hospital Stay: Payer: Medicare (Managed Care)

## 2024-10-27 ENCOUNTER — Inpatient Hospital Stay: Payer: Medicare (Managed Care) | Admitting: Oncology

## 2024-10-27 VITALS — BP 131/72 | HR 62 | Temp 97.6°F | Resp 17 | Ht 70.0 in | Wt 323.8 lb

## 2024-10-27 DIAGNOSIS — E785 Hyperlipidemia, unspecified: Secondary | ICD-10-CM | POA: Diagnosis not present

## 2024-10-27 DIAGNOSIS — D509 Iron deficiency anemia, unspecified: Secondary | ICD-10-CM | POA: Insufficient documentation

## 2024-10-27 DIAGNOSIS — E559 Vitamin D deficiency, unspecified: Secondary | ICD-10-CM | POA: Insufficient documentation

## 2024-10-27 DIAGNOSIS — D649 Anemia, unspecified: Secondary | ICD-10-CM

## 2024-10-27 DIAGNOSIS — I10 Essential (primary) hypertension: Secondary | ICD-10-CM | POA: Diagnosis not present

## 2024-10-27 LAB — CBC WITH DIFFERENTIAL (CANCER CENTER ONLY)
Abs Immature Granulocytes: 0.09 K/uL — ABNORMAL HIGH (ref 0.00–0.07)
Basophils Absolute: 0.1 K/uL (ref 0.0–0.1)
Basophils Relative: 1 %
Eosinophils Absolute: 0.3 K/uL (ref 0.0–0.5)
Eosinophils Relative: 4 %
HCT: 37.6 % — ABNORMAL LOW (ref 39.0–52.0)
Hemoglobin: 11.8 g/dL — ABNORMAL LOW (ref 13.0–17.0)
Immature Granulocytes: 2 %
Lymphocytes Relative: 34 %
Lymphs Abs: 2.1 K/uL (ref 0.7–4.0)
MCH: 25.8 pg — ABNORMAL LOW (ref 26.0–34.0)
MCHC: 31.4 g/dL (ref 30.0–36.0)
MCV: 82.3 fL (ref 80.0–100.0)
Monocytes Absolute: 0.6 K/uL (ref 0.1–1.0)
Monocytes Relative: 10 %
Neutro Abs: 3 K/uL (ref 1.7–7.7)
Neutrophils Relative %: 49 %
Platelet Count: 245 K/uL (ref 150–400)
RBC: 4.57 MIL/uL (ref 4.22–5.81)
RDW: 19.2 % — ABNORMAL HIGH (ref 11.5–15.5)
WBC Count: 6.1 K/uL (ref 4.0–10.5)
nRBC: 0 % (ref 0.0–0.2)

## 2024-10-27 LAB — CMP (CANCER CENTER ONLY)
ALT: 22 U/L (ref 0–44)
AST: 20 U/L (ref 15–41)
Albumin: 3.9 g/dL (ref 3.5–5.0)
Alkaline Phosphatase: 109 U/L (ref 38–126)
Anion gap: 7 (ref 5–15)
BUN: 11 mg/dL (ref 8–23)
CO2: 27 mmol/L (ref 22–32)
Calcium: 9.1 mg/dL (ref 8.9–10.3)
Chloride: 106 mmol/L (ref 98–111)
Creatinine: 0.89 mg/dL (ref 0.61–1.24)
GFR, Estimated: >60 mL/min (ref >60)
Glucose, Bld: 82 mg/dL (ref 70–99)
Potassium: 4.6 mmol/L (ref 3.5–5.1)
Sodium: 140 mmol/L (ref 135–145)
Total Bilirubin: 0.4 mg/dL (ref 0.0–1.2)
Total Protein: 7.3 g/dL (ref 6.5–8.1)

## 2024-10-27 LAB — TSH: TSH: 1.63 u[IU]/mL (ref 0.350–4.500)

## 2024-10-27 LAB — VITAMIN B12: Vitamin B-12: 297 pg/mL (ref 180–914)

## 2024-10-27 LAB — IRON AND IRON BINDING CAPACITY (CC-WL,HP ONLY)
Iron: 35 ug/dL — ABNORMAL LOW (ref 45–182)
Saturation Ratios: 10 % — ABNORMAL LOW (ref 17.9–39.5)
TIBC: 371 ug/dL (ref 250–450)
UIBC: 336 ug/dL

## 2024-10-27 LAB — FERRITIN: Ferritin: 34 ng/mL (ref 24–336)

## 2024-10-27 LAB — LACTATE DEHYDROGENASE: LDH: 155 U/L (ref 105–235)

## 2024-10-27 LAB — FOLATE: Folate: 12.5 ng/mL (ref >5.9)

## 2024-10-27 NOTE — Assessment & Plan Note (Addendum)
 On 08/09/2024, labs at his PCPs office showed hemoglobin of 12.5, hematocrit 40.6, MCV 81.5.  White count and platelet count were normal.  TSH normal.  CMP unremarkable.  Ferritin decreased at 20, iron saturation decreased to 12%, iron decreased at 43.  He was referred to us  for further evaluation and management of iron deficiency anemia.  On review of records, labs and December 2024 showed hemoglobin of 12.1, in May 2024, hemoglobin of 11.6.  Chronic, mild normocytic anemia with gradual improvement in hemoglobin over the past year.   He remains asymptomatic, with no evidence of acute blood loss.   A prior episode of melena two years ago may have contributed to previous lower hemoglobin, but there is no ongoing gastrointestinal bleeding or other sources of blood loss. White blood cell and platelet counts are within normal limits. Iron was previously mildly decreased.   Labs today showed persistent anemia with hemoglobin of 11.8, hematocrit 37.6.  MCV normal at 82.3.  White count and platelet count are normal with normal differential.  CMP unremarkable.  Iron studies indicate mild iron deficiency with iron saturation of 10%, iron decreased to 35.  Ferritin pending.  LDH normal.  B12, folate, TSH, haptoglobin are pending.  I will call him in 2 weeks to discuss results of remaining workup.  We will discuss iron replacement either orally or intravenously depending on ferritin level.  He is apparently due for colonoscopy in the next 2 years.  If iron deficiency is noted, we will refer him to GI for sooner workup.  - Scheduled in-person follow-up in four months.

## 2024-10-27 NOTE — Progress Notes (Signed)
 [facility]  HEMATOLOGY CLINIC CONSULTATION NOTE   PATIENT NAME: [facility]   MR#: [id number] DOB: 05-17-1951  DATE OF SERVICE: 10/27/2024  Patient Care Team: Catalina Bare, MD as PCP - General (Internal Medicine)  REASON FOR CONSULTATION/ CHIEF COMPLAINT:  Evaluation of anemia.  ASSESSMENT & PLAN:   Victor [facility] is a 73 y.o. gentleman with a past medical history of hypertension, dyslipidemia, OSA, vitamin D deficiency, morbid obesity, was referred to our service for evaluation of iron deficiency anemia.    Normocytic anemia On 08/09/2024, labs at his PCPs office showed hemoglobin of 12.5, hematocrit 40.6, MCV 81.5.  White count and platelet count were normal.  TSH normal.  CMP unremarkable.  Ferritin decreased at 20, iron saturation decreased to 12%, iron decreased at 43.  He was referred to us  for further evaluation and management of iron deficiency anemia.  On review of records, labs and December 2024 showed hemoglobin of 12.1, in May 2024, hemoglobin of 11.6.  Chronic, mild normocytic anemia with gradual improvement in hemoglobin over the past year.   He remains asymptomatic, with no evidence of acute blood loss.   A prior episode of melena two years ago may have contributed to previous lower hemoglobin, but there is no ongoing gastrointestinal bleeding or other sources of blood loss. White blood cell and platelet counts are within normal limits. Iron was previously mildly decreased.   Labs today showed persistent anemia with hemoglobin of 11.8, hematocrit 37.6.  MCV normal at 82.3.  White count and platelet count are normal with normal differential.  CMP unremarkable.  Iron studies indicate mild iron deficiency with iron saturation of 10%, iron decreased to 35.  Ferritin pending.  LDH normal.  B12, folate, TSH, haptoglobin are pending.  I will call him in 2 weeks to discuss results of remaining workup.  We will discuss iron replacement either  orally or intravenously depending on ferritin level.  He is apparently due for colonoscopy in the next 2 years.  If iron deficiency is noted, we will refer him to GI for sooner workup.  - Scheduled in-person follow-up in four months.   I reviewed lab results and outside records for this visit and discussed relevant results with the patient. Diagnosis, plan of care and treatment options were also discussed in detail with the patient. Opportunity provided to ask questions and answers provided to his apparent satisfaction. Provided instructions to call our clinic with any problems, questions or concerns prior to return visit. I recommended to continue follow-up with PCP and sub-specialists. He verbalized understanding and agreed with the plan. No barriers to learning was detected.  Victor Lehan, MD [name] - A DEPT OF [facility]. [facility] [address] [facility] [address] Dept: [phone] Dept Fax: [phone]  10/27/2024 1:23 PM  HISTORY OF PRESENT ILLNESS:  Discussed the use of AI scribe software for clinical note transcription with the patient, who gave verbal consent to proceed.  History of Present Illness Victor [facility] is a 73 year old male who presents for evaluation of chronic anemia.  On 08/09/2024, labs at his PCPs office showed hemoglobin of 12.5, hematocrit 40.6, MCV 81.5.  White count and platelet count were normal.  TSH normal.  CMP unremarkable.  Ferritin decreased at 20, iron saturation decreased to 12%, iron decreased at 43.  He was referred to us  for further evaluation and management of iron deficiency anemia.  On review of  records, labs and December 2024 showed hemoglobin of 12.1, in May 2024, hemoglobin of 11.6.  He was initially found to have anemia by his primary care physician due to low cell counts. Hemoglobin has gradually improved over the past year, from 11.6 g/dL in May 2024 to 87.8 g/dL in  December 2024, and most recently 12.5 g/dL in September 7974. He is unaware of his current hemoglobin value and denies fatigue or dyspnea.  He denies overt blood loss, including melena, hematochezia, hematuria, epistaxis, or gingival bleeding. He recalls a single episode of dark stools approximately two years ago during a period of stress and dietary changes, without recurrence. He has not received blood transfusions or donated blood recently. He takes a daily multivitamin containing folic acid, B12, B2, and vitamin D, but is unsure if it contains iron. He does not take dedicated iron supplementation.  He has undergone evaluation for lower extremity swelling, including a negative venous duplex scan in March 2025 and assessment by a varicose vein specialist. He suspects lymphedema. He previously considered cardiac causes. White blood cell and platelet counts have remained within normal limits.  He underwent colonoscopy in approximately 2023, which revealed three polyps; repeat colonoscopy was recommended in the next two years. Surgical history is notable only for childhood orchiopexy. He does not take statins due to adverse reactions, including finger contractures.     MEDICAL HISTORY:  Past Medical History:  Diagnosis Date   Hyperlipidemia    Hypertension    Morbid obesity (HCC)    Sleep apnea     SURGICAL HISTORY: History reviewed. No pertinent surgical history.  SOCIAL HISTORY: He reports that he quit smoking about 28 years ago. His smoking use included cigarettes. He started smoking about 32 years ago. He has a 2.5 pack-year smoking history. He has quit using smokeless tobacco. He reports current alcohol  use. He reports that he does not use drugs. Social History   Socioeconomic History   Marital status: Divorced    Spouse name: Not on file   Number of children: Not on file   Years of education: Not on file   Highest education level: Not on file  Occupational History   Occupation:  care provider    Comment: self employed  Tobacco Use   Smoking status: Former    Current packs/day: 0.00    Average packs/day: 0.6 packs/day for 4.0 years (2.5 ttl pk-yrs)    Types: Cigarettes    Start date: 11/17/1991    Quit date: 11/17/1995    Years since quitting: 28.9   Smokeless tobacco: Former  Substance and Sexual Activity   Alcohol  use: Yes    Comment: social   Drug use: No   Sexual activity: Not on file  Other Topics Concern   Not on file  Social History Narrative   Not on file   Social Drivers of Health   Tobacco Use: Medium Risk (10/27/2024)   Patient History    Smoking Tobacco Use: Former    Smokeless Tobacco Use: Former    Passive Exposure: Not on Stage Manager: Not on file  Food Insecurity: No Food Insecurity (10/27/2024)   Epic    Worried About Programme Researcher, Broadcasting/film/video in the Last Year: Never true    Ran Out of Food in the Last Year: Never true  Transportation Needs: No Transportation Needs (10/27/2024)   Epic    Lack of Transportation (Medical): No    Lack of Transportation (Non-Medical): No  Physical Activity: Not on  file  Stress: Not on file  Social Connections: Unknown (03/30/2022)   Received from [facility]   Social Network    Social Network: Not on file  Intimate Partner Violence: Not At Risk (10/27/2024)   Epic    Fear of Current or Ex-Partner: No    Emotionally Abused: No    Physically Abused: No    Sexually Abused: No  Depression (PHQ2-9): Low Risk (10/27/2024)   Depression (PHQ2-9)    PHQ-2 Score: 1  Alcohol  Screen: Not on file  Housing: Low Risk (10/27/2024)   Epic    Unable to Pay for Housing in the Last Year: No    Number of Times Moved in the Last Year: 0    Homeless in the Last Year: No  Utilities: Not At Risk (10/27/2024)   Epic    Threatened with loss of utilities: No  Health Literacy: Not on file    FAMILY HISTORY: Family History  Problem Relation Age of Onset   Lung cancer Mother    Lung cancer Father     Lung cancer Brother    COPD Brother    Hypertension Mother     ALLERGIES:  He is allergic to penicillins.  MEDICATIONS:  Current Outpatient Medications  Medication Sig Dispense Refill   Aspirin-Salicylamide-Caffeine (BC HEADACHE POWDER PO) Take by mouth.     Green Tea, Camellia sinensis, (GREEN TEA PO) Take by mouth.     Multiple Vitamins-Minerals (MULTIVITAMIN WITH MINERALS) tablet Take 1 tablet by mouth daily.     No current facility-administered medications for this visit.    REVIEW OF SYSTEMS:    Review of Systems - Oncology  All other pertinent systems were reviewed and were negative except as mentioned above.  PHYSICAL EXAMINATION:   Onc Performance Status - 10/27/24 1026       ECOG Perf Status   ECOG Perf Status Fully active, able to carry on all pre-disease performance without restriction      KPS SCALE   KPS % SCORE Normal activity with effort, some s/s of disease          Vitals:   10/27/24 1012  BP: 131/72  Pulse: 62  Resp: 17  Temp: 97.6 F (36.4 C)  SpO2: 100%   Filed Weights   10/27/24 1012  Weight: (!) 323 lb 12.8 oz (146.9 kg)    Physical Exam Constitutional:      General: He is not in acute distress.    Appearance: Normal appearance.  HENT:     Head: Normocephalic and atraumatic.  Eyes:     Conjunctiva/sclera: Conjunctivae normal.  Cardiovascular:     Rate and Rhythm: Normal rate and regular rhythm.  Pulmonary:     Effort: Pulmonary effort is normal. No respiratory distress.  Abdominal:     General: There is no distension.  Musculoskeletal:     Right lower leg: Edema present.     Left lower leg: Edema present.  Neurological:     General: No focal deficit present.     Mental Status: He is alert and oriented to person, place, and time.  Psychiatric:        Mood and Affect: Mood normal.        Behavior: Behavior normal.      LABORATORY DATA:   I have reviewed the data as listed.  Results for orders placed or performed  in visit on 10/27/24  CBC with Differential (Cancer Center Only)  Result Value Ref Range   WBC Count 6.1  4.0 - 10.5 K/uL   RBC 4.57 4.22 - 5.81 MIL/uL   Hemoglobin 11.8 (L) 13.0 - 17.0 g/dL   HCT 62.3 (L) 60.9 - 47.9 %   MCV 82.3 80.0 - 100.0 fL   MCH 25.8 (L) 26.0 - 34.0 pg   MCHC 31.4 30.0 - 36.0 g/dL   RDW 80.7 (H) 88.4 - 84.4 %   Platelet Count 245 150 - 400 K/uL   nRBC 0.0 0.0 - 0.2 %   Neutrophils Relative % 49 %   Neutro Abs 3.0 1.7 - 7.7 K/uL   Lymphocytes Relative 34 %   Lymphs Abs 2.1 0.7 - 4.0 K/uL   Monocytes Relative 10 %   Monocytes Absolute 0.6 0.1 - 1.0 K/uL   Eosinophils Relative 4 %   Eosinophils Absolute 0.3 0.0 - 0.5 K/uL   Basophils Relative 1 %   Basophils Absolute 0.1 0.0 - 0.1 K/uL   Immature Granulocytes 2 %   Abs Immature Granulocytes 0.09 (H) 0.00 - 0.07 K/uL  CMP (Cancer Center only)  Result Value Ref Range   Sodium 140 135 - 145 mmol/L   Potassium 4.6 3.5 - 5.1 mmol/L   Chloride 106 98 - 111 mmol/L   CO2 27 22 - 32 mmol/L   Glucose, Bld 82 70 - 99 mg/dL   BUN 11 8 - 23 mg/dL   Creatinine 9.10 9.38 - 1.24 mg/dL   Calcium 9.1 8.9 - 89.6 mg/dL   Total Protein 7.3 6.5 - 8.1 g/dL   Albumin 3.9 3.5 - 5.0 g/dL   AST 20 15 - 41 U/L   ALT 22 0 - 44 U/L   Alkaline Phosphatase 109 38 - 126 U/L   Total Bilirubin 0.4 0.0 - 1.2 mg/dL   GFR, Estimated >39 >39 mL/min   Anion gap 7 5 - 15  Iron and Iron Binding Capacity ([facility] only)  Result Value Ref Range   Iron 35 (L) 45 - 182 ug/dL   TIBC 628 749 - 549 ug/dL   Saturation Ratios 10 (L) 17.9 - 39.5 %   UIBC 336 ug/dL  Lactate dehydrogenase  Result Value Ref Range   LDH 155 105 - 235 U/L    RADIOGRAPHIC STUDIES:  No recent pertinent imaging studies available to review.  Orders Placed This Encounter  Procedures   CBC with Differential (Cancer Center Only)    Standing Status:   Victor    Number of Occurrences:   1    Expiration Date:   10/27/2025   CMP (Cancer Center only)    Standing  Status:   Victor    Number of Occurrences:   1    Expiration Date:   10/27/2025   Iron and Iron Binding Capacity ([facility] only)    Standing Status:   Victor    Number of Occurrences:   1    Expiration Date:   10/27/2025   Ferritin    Standing Status:   Victor    Number of Occurrences:   1    Expiration Date:   10/27/2025   Vitamin B12    Standing Status:   Victor    Number of Occurrences:   1    Expiration Date:   10/27/2025   Folate    Standing Status:   Victor    Number of Occurrences:   1    Expiration Date:   10/27/2025   Lactate dehydrogenase    Standing Status:   Victor    Number of Occurrences:  1    Expiration Date:   10/27/2025   Methylmalonic acid, serum    Standing Status:   Victor    Number of Occurrences:   1    Expiration Date:   10/27/2025   TSH    Standing Status:   Victor    Number of Occurrences:   1    Expiration Date:   10/27/2025   Haptoglobin    Standing Status:   Victor    Number of Occurrences:   1    Expiration Date:   10/27/2025    [facility] Appointments  Date Time Provider Department Center  11/10/2024  1:00 PM Naevia Unterreiner, Chinita, MD [facility] None  01/23/2025  1:30 PM Loreda Hacker, DPM [facility]  03/01/2025  9:15 AM [facility] None  03/01/2025  9:45 AM Aayden Cefalu, Chinita, MD [facility] None  04/25/2025  9:15 AM Tobie Franky SQUIBB, DPM [facility]     I spent a total of 50 minutes during this encounter with the patient including review of chart and various tests results, discussions about plan of care and coordination of care plan.  This document was completed utilizing speech recognition software. Grammatical errors, random word insertions, pronoun errors, and incomplete sentences are an occasional consequence of this system due to software limitations, ambient noise, and hardware issues. Any formal questions or concerns about the content, text or information contained within the body of this dictation should be  directly addressed to the provider for clarification.

## 2024-10-28 LAB — HAPTOGLOBIN: Haptoglobin: 173 mg/dL (ref 34–355)

## 2024-10-31 ENCOUNTER — Telehealth: Payer: Self-pay

## 2024-10-31 NOTE — Telephone Encounter (Signed)
° °  10/31/2024 Name: [facility] MRN: [id number] DOB: 1951/02/26  Chief Complaint  Patient presents with   Medication Management   Hypertension    Contacted patient to discuss medication management and hypertension management. The patient was not at home during the scheduled appointment time and did not have his medications with him. The patient requests to give me a call back at his convenience when he is home and able to review his medications and discuss his blood pressure management.   I follow up with the patient in a few days if I have not heard back by then.   Heather Factor, PharmD Clinical Pharmacist  [phone]

## 2024-11-01 LAB — METHYLMALONIC ACID, SERUM: Methylmalonic Acid, Quantitative: 332 nmol/L (ref 0–378)

## 2024-11-10 ENCOUNTER — Inpatient Hospital Stay: Admitting: Oncology

## 2024-11-10 ENCOUNTER — Encounter: Payer: Self-pay | Admitting: Oncology

## 2024-11-10 DIAGNOSIS — D649 Anemia, unspecified: Secondary | ICD-10-CM

## 2024-11-10 MED ORDER — FERROUS SULFATE 325 (65 FE) MG PO TBEC: 325.0000 mg | DELAYED_RELEASE_TABLET | Freq: Every day | ORAL | 5 refills | Status: AC

## 2024-11-10 NOTE — Assessment & Plan Note (Signed)
 On 08/09/2024, labs at his PCPs office showed hemoglobin of 12.5, hematocrit 40.6, MCV 81.5.  White count and platelet count were normal.  TSH normal.  CMP unremarkable.  Ferritin decreased at 20, iron saturation decreased to 12%, iron decreased at 43.  He was referred to us  for further evaluation and management of iron deficiency anemia.  On review of records, labs and December 2024 showed hemoglobin of 12.1, in May 2024, hemoglobin of 11.6.  Chronic, mild normocytic anemia with gradual improvement in hemoglobin over the past year.   He remains asymptomatic, with no evidence of acute blood loss.   A prior episode of melena two years ago may have contributed to previous lower hemoglobin, but there is no ongoing gastrointestinal bleeding or other sources of blood loss. White blood cell and platelet counts are within normal limits. Iron was previously mildly decreased.   On his consultation with us  on 10/27/2024, labs showed persistent anemia with hemoglobin of 11.8, hematocrit 37.6.  MCV normal at 82.3.  White count and platelet count are normal with normal differential.  CMP unremarkable.  Iron studies indicate mild iron deficiency with iron saturation of 10%, iron decreased to 35.  Ferritin normal at 34.  LDH, B12, folate, methylmalonic acid, TSH, haptoglobin were all within normal limits.    Started on oral ferrous sulfate  325 mg once daily.  He was advised to take it with vitamin C to enhance absorption.  If persistent iron deficiency, we will proceed with IV iron in the future.   - Recommended increased dietary intake of iron-rich foods, including dark leafy greens (spinach, kale, broccoli), meat, and liver.   He is apparently due for colonoscopy in 2027.  If persistent iron deficiency is noted, we will refer him to GI for sooner workup.  - Scheduled in-person follow-up in four months.

## 2024-11-10 NOTE — Progress Notes (Signed)
 "  [facility]  HEMATOLOGY-ONCOLOGY ELECTRONIC VISIT PROGRESS NOTE  PATIENT NAME: Victor Garrison   MR#: [id number] DOB: March 05, 1951  DATE OF SERVICE: 11/10/2024  Patient Care Team: Catalina Bare, MD as PCP - General (Internal Medicine)  I connected with the patient via telephone conference and verified that I am speaking with the correct person using two identifiers. The patient's location is at home and I am providing care from the [facility].  I discussed the limitations, risks, security and privacy concerns of performing an evaluation and management service by e-visits and the availability of in person appointments. I also discussed with the patient that there may be a patient responsible charge related to this service. The patient expressed understanding and agreed to proceed.   ASSESSMENT & PLAN:   Victor Garrison is a 73 y.o. gentleman with a past medical history of hypertension, dyslipidemia, OSA, vitamin D deficiency, morbid obesity, was referred to our service in December 2025 for evaluation of iron deficiency anemia.    Normocytic anemia On 08/09/2024, labs at his PCPs office showed hemoglobin of 12.5, hematocrit 40.6, MCV 81.5.  White count and platelet count were normal.  TSH normal.  CMP unremarkable.  Ferritin decreased at 20, iron saturation decreased to 12%, iron decreased at 43.  He was referred to us  for further evaluation and management of iron deficiency anemia.  On review of records, labs and December 2024 showed hemoglobin of 12.1, in May 2024, hemoglobin of 11.6.  Chronic, mild normocytic anemia with gradual improvement in hemoglobin over the past year.   He remains asymptomatic, with no evidence of acute blood loss.   A prior episode of melena two years ago may have contributed to previous lower hemoglobin, but there is no ongoing gastrointestinal bleeding or other sources of blood loss. White blood cell and platelet counts are within normal  limits. Iron was previously mildly decreased.   On his consultation with us  on 10/27/2024, labs showed persistent anemia with hemoglobin of 11.8, hematocrit 37.6.  MCV normal at 82.3.  White count and platelet count are normal with normal differential.  CMP unremarkable.  Iron studies indicate mild iron deficiency with iron saturation of 10%, iron decreased to 35.  Ferritin normal at 34.  LDH, B12, folate, methylmalonic acid, TSH, haptoglobin were all within normal limits.    Started on oral ferrous sulfate  325 mg once daily.  He was advised to take it with vitamin C to enhance absorption.  If persistent iron deficiency, we will proceed with IV iron in the future.   - Recommended increased dietary intake of iron-rich foods, including dark leafy greens (spinach, kale, broccoli), meat, and liver.   He is apparently due for colonoscopy in 2027.  If persistent iron deficiency is noted, we will refer him to GI for sooner workup.  - Scheduled in-person follow-up in four months.     I discussed the assessment and treatment plan with the patient. The patient was provided an opportunity to ask questions and all were answered. The patient agreed with the plan and demonstrated an understanding of the instructions. The patient was advised to call back or seek an in-person evaluation if the symptoms worsen or if the condition fails to improve as anticipated.    I spent 15 minutes over the phone with the patient reviewing test results, discuss management and coordination/planning of care.  Chinita Patten, MD 11/10/2024 3:46 PM [facility] - A DEPT  OF [facility] [address] [place] [address] Dept: [phone] Dept Fax: [phone]   INTERVAL HISTORY:  Please see above for problem oriented charting.  The purpose of today's discussion is to explain recent lab results and to formulate plan of care.  Discussed the use of AI  scribe software for clinical note transcription with the patient, who gave verbal consent to proceed.  History of Present Illness Victor Garrison is a 73 year old male with iron deficiency anemia who presents for hematology follow-up regarding persistent anemia and iron deficiency.  Hemoglobin remains mildly decreased, with a most recent value of 11.8 g/dL. Iron studies show persistent iron deficiency, with iron saturation at 10%. White blood cell and platelet counts are within normal limits. Renal function, hepatic enzymes, electrolytes, and thyroid  function are all normal.  He denies symptoms of anemia, including fatigue, dyspnea, or palpitations. He also denies abnormal bleeding, ecchymosis, gastrointestinal blood loss, or changes in bowel habits.  He reports a balanced diet with adequate intake of whole grains, fruits, and dairy, and prefers to obtain iron from dark leafy vegetables rather than meat. He recently initiated a daily multivitamin but has not used dedicated oral iron supplementation.  He recalls a prior episode of iron deficiency approximately 15-20 years ago, with a negative capsule endoscopy for gastrointestinal bleeding performed about ten years ago. He is considering switching to a multivitamin containing iodine and magnesium.    SUMMARY OF HEMATOLOGY HISTORY:  He was referred to our clinic for evaluation of chronic anemia.   On 08/09/2024, labs at his PCPs office showed hemoglobin of 12.5, hematocrit 40.6, MCV 81.5.  White count and platelet count were normal.  TSH normal.  CMP unremarkable.  Ferritin decreased at 20, iron saturation decreased to 12%, iron decreased at 43.  He was referred to us  for further evaluation and management of iron deficiency anemia.   On review of records, labs and December 2024 showed hemoglobin of 12.1, in May 2024, hemoglobin of 11.6.   He was initially found to have anemia by his primary care physician due to low cell counts. Hemoglobin has  gradually improved over the past year, from 11.6 g/dL in May 2024 to 87.8 g/dL in December 2024, and most recently 12.5 g/dL in September 7974. He is unaware of his current hemoglobin value and denies fatigue or dyspnea.   He denies overt blood loss, including melena, hematochezia, hematuria, epistaxis, or gingival bleeding. He recalls a single episode of dark stools approximately two years ago during a period of stress and dietary changes, without recurrence. He has not received blood transfusions or donated blood recently. He takes a daily multivitamin containing folic acid , B12, B2, and vitamin D, but is unsure if it contains iron. He does not take dedicated iron supplementation.   He has undergone evaluation for lower extremity swelling, including a negative venous duplex scan in March 2025 and assessment by a varicose vein specialist. He suspects lymphedema. He previously considered cardiac causes. White blood cell and platelet counts have remained within normal limits.   He underwent colonoscopy in approximately 2023, which revealed three polyps; repeat colonoscopy was recommended in the next two years. Surgical history is notable only for childhood orchiopexy. He does not take statins due to adverse reactions, including finger contractures.  On his consultation with us  on 10/27/2024, labs showed persistent anemia with hemoglobin of 11.8, hematocrit 37.6.  MCV normal at 82.3.  White count and platelet count are normal with normal differential.  CMP unremarkable.  Iron studies indicate mild iron deficiency with iron saturation of 10%, iron decreased to 35.  Ferritin normal at 34.  LDH, B12, folate, methylmalonic acid, TSH, haptoglobin were all within normal limits.    Started on oral ferrous sulfate  325 mg once daily. If persistent iron deficiency, we will proceed with IV iron in the future.    He is apparently due for colonoscopy in 2027.  If persistent iron deficiency is noted, we will refer him  to GI for sooner workup.    REVIEW OF SYSTEMS:    Review of Systems - Oncology  All other pertinent systems were reviewed with the patient and are negative.  I have reviewed the past medical history, past surgical history, social history and family history with the patient and they are unchanged from previous note.  ALLERGIES:  He is allergic to penicillins.  MEDICATIONS:  Current Outpatient Medications  Medication Sig Dispense Refill   ferrous sulfate  325 (65 FE) MG EC tablet Take 1 tablet (325 mg total) by mouth daily with breakfast. 30 tablet 5   Aspirin-Salicylamide-Caffeine (BC HEADACHE POWDER PO) Take by mouth.     Green Tea, Camellia sinensis, (GREEN TEA PO) Take by mouth.     Multiple Vitamins-Minerals (MULTIVITAMIN WITH MINERALS) tablet Take 1 tablet by mouth daily.     No current facility-administered medications for this visit.    PHYSICAL EXAMINATION:  Not performed today as it was a phone only visit  LABORATORY DATA:   I have reviewed the data as listed.  Recent Results (from the past 2160 hours)  CBC with Differential (Cancer Center Only)     Status: Abnormal   Collection Time: 10/27/24 12:37 PM  Result Value Ref Range   WBC Count 6.1 4.0 - 10.5 K/uL   RBC 4.57 4.22 - 5.81 MIL/uL   Hemoglobin 11.8 (L) 13.0 - 17.0 g/dL   HCT 62.3 (L) 60.9 - 47.9 %   MCV 82.3 80.0 - 100.0 fL   MCH 25.8 (L) 26.0 - 34.0 pg   MCHC 31.4 30.0 - 36.0 g/dL   RDW 80.7 (H) 88.4 - 84.4 %   Platelet Count 245 150 - 400 K/uL   nRBC 0.0 0.0 - 0.2 %   Neutrophils Relative % 49 %   Neutro Abs 3.0 1.7 - 7.7 K/uL   Lymphocytes Relative 34 %   Lymphs Abs 2.1 0.7 - 4.0 K/uL   Monocytes Relative 10 %   Monocytes Absolute 0.6 0.1 - 1.0 K/uL   Eosinophils Relative 4 %   Eosinophils Absolute 0.3 0.0 - 0.5 K/uL   Basophils Relative 1 %   Basophils Absolute 0.1 0.0 - 0.1 K/uL   Immature Granulocytes 2 %   Abs Immature Granulocytes 0.09 (H) 0.00 - 0.07 K/uL    Comment: Performed at [facility], [address], [place], [place] [address]  CMP (Cancer Center only)     Status: None   Collection Time: 10/27/24 12:37 PM  Result Value Ref Range   Sodium 140 135 - 145 mmol/L   Potassium 4.6 3.5 - 5.1 mmol/L   Chloride 106 98 - 111 mmol/L   CO2 27 22 - 32 mmol/L   Glucose, Bld 82 70 - 99 mg/dL    Comment: Glucose reference range applies only to samples taken after fasting for at least 8 hours.   BUN 11 8 - 23 mg/dL   Creatinine 9.10 9.38 - 1.24 mg/dL   Calcium 9.1 8.9 -  10.3 mg/dL   Total Protein 7.3 6.5 - 8.1 g/dL   Albumin 3.9 3.5 - 5.0 g/dL   AST 20 15 - 41 U/L   ALT 22 0 - 44 U/L   Alkaline Phosphatase 109 38 - 126 U/L   Total Bilirubin 0.4 0.0 - 1.2 mg/dL   GFR, Estimated >39 >39 mL/min    Comment: (NOTE) Calculated using the CKD-EPI Creatinine Equation (2021)    Anion gap 7 5 - 15    Comment: Performed at [facility], [address] [place], [place] [address]  Iron and Iron Binding Capacity ([facility] only)     Status: Abnormal   Collection Time: 10/27/24 12:37 PM  Result Value Ref Range   Iron 35 (L) 45 - 182 ug/dL   TIBC 628 749 - 549 ug/dL   Saturation Ratios 10 (L) 17.9 - 39.5 %   UIBC 336 ug/dL    Comment: Performed at [facility], [address] [place], [place] [address]  Ferritin     Status: None   Collection Time: 10/27/24 12:37 PM  Result Value Ref Range   Ferritin 34 24 - 336 ng/mL    Comment: Performed at [facility], [address], [place], [place] [address]  Vitamin B12     Status: None   Collection Time: 10/27/24 12:37 PM  Result Value Ref Range   Vitamin B-12 297 180 - 914 pg/mL    Comment: Performed at [facility], [address] [place], [place] [address]  Folate     Status: None   Collection Time: 10/27/24 12:37 PM  Result Value Ref Range   Folate 12.5 >5.9 ng/mL    Comment: Performed at [facility], [address], [place], [place] [address]  Lactate dehydrogenase     Status: None   Collection Time: 10/27/24 12:37 PM  Result Value Ref Range   LDH 155 105 - 235 U/L    Comment: Performed at [facility], [address], [place], [place] [address]  Methylmalonic acid, serum     Status: None   Collection Time: 10/27/24 12:37 PM  Result Value Ref Range   Methylmalonic Acid, Quantitative 332 0 - 378 nmol/L    Comment: (NOTE) This test was developed and its performance characteristics determined by [facility]. It has not been cleared or approved by the Food and Drug Administration. Performed At: [facility] [address] [place], [place] [id number] Jennette Shorter MD Ey:[id number]   TSH     Status: None   Collection Time: 10/27/24 12:37 PM  Result Value Ref Range   TSH 1.630 0.350 - 4.500 uIU/mL    Comment: Performed at [facility], [address], [place], [place] [address]  Haptoglobin     Status: None   Collection Time: 10/27/24 12:37 PM  Result Value Ref Range   Haptoglobin 173 34 - 355 mg/dL    Comment: (NOTE) Performed At: [facility] [address] [place], [place] [id number] Jennette Shorter MD Ey:[id number]      RADIOGRAPHIC STUDIES:  No recent pertinent imaging studies available to review.  Orders Placed This Encounter  Procedures   CBC with Differential (Cancer Center Only)    Standing Status:   Future    Expiration Date:   11/10/2025   Iron and Iron Binding Capacity ([facility] only)    Standing Status:   Future    Expiration Date:   11/10/2025  Ferritin    Standing Status:   Future    Expiration Date:   11/10/2025     Future Appointments  Date Time Provider Department Center  01/23/2025  1:30 PM Loreda Hacker, DPM [facility]  03/01/2025  9:15 AM [facility] None  03/01/2025  9:45 AM Loran Fleet, Chinita, MD [facility] None  04/25/2025  9:15 AM Tobie Franky SQUIBB, DPM  [facility]    This document was completed utilizing speech recognition software. Grammatical errors, random word insertions, pronoun errors, and incomplete sentences are an occasional consequence of this system due to software limitations, ambient noise, and hardware issues. Any formal questions or concerns about the content, text or information contained within the body of this dictation should be directly addressed to the provider for clarification.  "

## 2024-11-28 ENCOUNTER — Telehealth: Payer: Self-pay

## 2024-11-28 NOTE — Progress Notes (Signed)
" ° °  11/28/2024  Patient ID: Victor Garrison, male   DOB: May 18, 1951, 74 y.o.   MRN: [id number]  Contacted patient regarding referral for medication management from Catalina Bare, MD .   I was unable to leave a message for the patient as his voicemail is full. I will follow up with the patient next week.  Heather Factor, PharmD Clinical Pharmacist  [phone]    "

## 2024-12-05 NOTE — Progress Notes (Signed)
" ° °  12/05/2024  Patient ID: Victor Garrison, male   DOB: 1951/03/08, 74 y.o.   MRN: [id number]  Contacted patient regarding referral for medication management from Catalina Bare, MD .   Appointment scheduled for 12/19/24 at 11:00 am.   Heather Factor, PharmD Clinical Pharmacist  [phone]    "

## 2024-12-19 ENCOUNTER — Telehealth: Payer: Self-pay

## 2024-12-19 NOTE — Progress Notes (Signed)
" ° °  12/19/2024  Patient ID: Victor Garrison, male   DOB: 1951/09/09, 74 y.o.   MRN: [id number]  Attempted to contact patient for scheduled appointment for medication management.    I was unable to leave a message for the patient as his voicemail is full. I will follow up next week.   Heather Factor, PharmD Clinical Pharmacist  [phone]    "

## 2025-01-23 ENCOUNTER — Ambulatory Visit: Admitting: Podiatry

## 2025-03-01 ENCOUNTER — Inpatient Hospital Stay: Admitting: Oncology

## 2025-03-01 ENCOUNTER — Inpatient Hospital Stay

## 2025-04-25 ENCOUNTER — Ambulatory Visit: Admitting: Podiatry
